# Patient Record
Sex: Female | Born: 1960 | Race: White | Hispanic: No | Marital: Married | Smoking: Never smoker
Health system: Southern US, Community
[De-identification: ages and names within clinical notes are randomized; demographics above are authoritative.]

## PROBLEM LIST (undated history)

## (undated) DIAGNOSIS — H269 Unspecified cataract: Secondary | ICD-10-CM

## (undated) DIAGNOSIS — I10 Essential (primary) hypertension: Secondary | ICD-10-CM

## (undated) DIAGNOSIS — E119 Type 2 diabetes mellitus without complications: Secondary | ICD-10-CM

## (undated) DIAGNOSIS — F419 Anxiety disorder, unspecified: Secondary | ICD-10-CM

## (undated) HISTORY — PX: APPENDECTOMY: SHX54

## (undated) HISTORY — PX: BREAST SURGERY: SHX581

## (undated) HISTORY — DX: Type 2 diabetes mellitus without complications: E11.9

## (undated) HISTORY — DX: Anxiety disorder, unspecified: F41.9

## (undated) HISTORY — DX: Unspecified cataract: H26.9

## (undated) HISTORY — DX: Essential (primary) hypertension: I10

---

## 1998-04-30 ENCOUNTER — Other Ambulatory Visit: Admission: RE | Admit: 1998-04-30 | Discharge: 1998-04-30 | Payer: Self-pay | Admitting: Obstetrics and Gynecology

## 1998-05-05 ENCOUNTER — Encounter: Admission: RE | Admit: 1998-05-05 | Discharge: 1998-08-03 | Payer: Self-pay | Admitting: Endocrinology

## 1998-09-02 ENCOUNTER — Inpatient Hospital Stay (HOSPITAL_COMMUNITY): Admission: AD | Admit: 1998-09-02 | Discharge: 1998-09-02 | Payer: Self-pay | Admitting: *Deleted

## 1998-09-06 ENCOUNTER — Inpatient Hospital Stay (HOSPITAL_COMMUNITY): Admission: AD | Admit: 1998-09-06 | Discharge: 1998-09-06 | Payer: Self-pay | Admitting: Obstetrics and Gynecology

## 1998-09-08 ENCOUNTER — Inpatient Hospital Stay (HOSPITAL_COMMUNITY): Admission: AD | Admit: 1998-09-08 | Discharge: 1998-09-11 | Payer: Self-pay | Admitting: Obstetrics and Gynecology

## 1998-09-15 ENCOUNTER — Encounter (HOSPITAL_COMMUNITY): Admission: RE | Admit: 1998-09-15 | Discharge: 1998-12-14 | Payer: Self-pay | Admitting: Obstetrics and Gynecology

## 1999-06-30 ENCOUNTER — Other Ambulatory Visit: Admission: RE | Admit: 1999-06-30 | Discharge: 1999-06-30 | Payer: Self-pay | Admitting: Obstetrics and Gynecology

## 2000-08-14 ENCOUNTER — Other Ambulatory Visit: Admission: RE | Admit: 2000-08-14 | Discharge: 2000-08-14 | Payer: Self-pay | Admitting: Obstetrics and Gynecology

## 2001-07-04 ENCOUNTER — Other Ambulatory Visit: Admission: RE | Admit: 2001-07-04 | Discharge: 2001-07-04 | Payer: Self-pay | Admitting: Obstetrics and Gynecology

## 2002-09-01 ENCOUNTER — Other Ambulatory Visit: Admission: RE | Admit: 2002-09-01 | Discharge: 2002-09-01 | Payer: Self-pay | Admitting: Obstetrics and Gynecology

## 2003-05-06 ENCOUNTER — Emergency Department (HOSPITAL_COMMUNITY): Admission: EM | Admit: 2003-05-06 | Discharge: 2003-05-06 | Payer: Self-pay | Admitting: Emergency Medicine

## 2003-12-08 ENCOUNTER — Other Ambulatory Visit: Admission: RE | Admit: 2003-12-08 | Discharge: 2003-12-08 | Payer: Self-pay | Admitting: Obstetrics and Gynecology

## 2004-06-23 ENCOUNTER — Encounter: Admission: RE | Admit: 2004-06-23 | Discharge: 2004-06-23 | Payer: Self-pay | Admitting: Obstetrics and Gynecology

## 2004-11-22 ENCOUNTER — Other Ambulatory Visit: Admission: RE | Admit: 2004-11-22 | Discharge: 2004-11-22 | Payer: Self-pay | Admitting: Obstetrics and Gynecology

## 2005-03-31 ENCOUNTER — Ambulatory Visit: Payer: Self-pay | Admitting: Family Medicine

## 2005-07-17 ENCOUNTER — Ambulatory Visit: Payer: Self-pay | Admitting: Family Medicine

## 2005-08-23 ENCOUNTER — Encounter: Payer: Self-pay | Admitting: Obstetrics and Gynecology

## 2005-10-04 ENCOUNTER — Encounter: Admission: RE | Admit: 2005-10-04 | Discharge: 2005-10-04 | Payer: Self-pay | Admitting: Obstetrics and Gynecology

## 2006-07-02 ENCOUNTER — Encounter: Admission: RE | Admit: 2006-07-02 | Discharge: 2006-07-02 | Payer: Self-pay | Admitting: Obstetrics and Gynecology

## 2006-12-27 ENCOUNTER — Encounter: Admission: RE | Admit: 2006-12-27 | Discharge: 2006-12-27 | Payer: Self-pay | Admitting: Obstetrics and Gynecology

## 2008-01-30 ENCOUNTER — Encounter: Admission: RE | Admit: 2008-01-30 | Discharge: 2008-01-30 | Payer: Self-pay | Admitting: Obstetrics and Gynecology

## 2009-02-01 ENCOUNTER — Encounter: Admission: RE | Admit: 2009-02-01 | Discharge: 2009-02-01 | Payer: Self-pay | Admitting: Obstetrics and Gynecology

## 2009-02-05 ENCOUNTER — Encounter: Admission: RE | Admit: 2009-02-05 | Discharge: 2009-02-05 | Payer: Self-pay | Admitting: Obstetrics and Gynecology

## 2009-02-24 ENCOUNTER — Encounter (INDEPENDENT_AMBULATORY_CARE_PROVIDER_SITE_OTHER): Payer: Self-pay | Admitting: Surgery

## 2009-02-24 ENCOUNTER — Ambulatory Visit (HOSPITAL_BASED_OUTPATIENT_CLINIC_OR_DEPARTMENT_OTHER): Admission: RE | Admit: 2009-02-24 | Discharge: 2009-02-24 | Payer: Self-pay | Admitting: Surgery

## 2010-05-15 ENCOUNTER — Encounter: Payer: Self-pay | Admitting: Obstetrics and Gynecology

## 2010-07-27 LAB — GLUCOSE, CAPILLARY
Glucose-Capillary: 135 mg/dL — ABNORMAL HIGH (ref 70–99)
Glucose-Capillary: 136 mg/dL — ABNORMAL HIGH (ref 70–99)

## 2010-07-27 LAB — BASIC METABOLIC PANEL
CO2: 29 mEq/L (ref 19–32)
Calcium: 9 mg/dL (ref 8.4–10.5)
GFR calc non Af Amer: >60 mL/min (ref >60)

## 2012-05-22 ENCOUNTER — Other Ambulatory Visit: Payer: Self-pay

## 2012-06-08 ENCOUNTER — Ambulatory Visit: Payer: Self-pay | Admitting: Emergency Medicine

## 2012-06-08 VITALS — BP 128/88 | HR 97 | Temp 99.0°F | Resp 16 | Ht 62.0 in | Wt 220.4 lb

## 2012-06-08 DIAGNOSIS — J209 Acute bronchitis, unspecified: Secondary | ICD-10-CM

## 2012-06-08 DIAGNOSIS — J018 Other acute sinusitis: Secondary | ICD-10-CM

## 2012-06-08 MED ORDER — HYDROCOD POLST-CHLORPHEN POLST 10-8 MG/5ML PO LQCR: 5.0000 mL | Freq: Two times a day (BID) | ORAL | Status: DC | PRN

## 2012-06-08 MED ORDER — LEVOFLOXACIN 500 MG PO TABS: 500.0000 mg | ORAL_TABLET | Freq: Every day | ORAL | Status: DC

## 2012-06-08 MED ORDER — CEFPROZIL 500 MG PO TABS: 500.0000 mg | ORAL_TABLET | Freq: Two times a day (BID) | ORAL | Status: AC

## 2012-06-08 MED ORDER — PSEUDOEPHEDRINE-GUAIFENESIN ER 60-600 MG PO TB12: 1.0000 | ORAL_TABLET | Freq: Two times a day (BID) | ORAL | Status: AC

## 2012-06-08 NOTE — Patient Instructions (Addendum)

## 2012-06-08 NOTE — Addendum Note (Signed)
Addended by: Carmelina Dane on: 06/08/2012 03:11 PM   Modules accepted: Orders, Medications

## 2012-06-08 NOTE — Progress Notes (Signed)
[facility] [address] [place] [address] [phone]  Date:  06/08/2012   Name:  Michaela Parker   DOB:  11/15/1960   MRN:  [id number]  PCP:  No primary provider on file.    Chief Complaint: Cough, Shortness of Breath, Sore Throat and Adenopathy   History of Present Illness:  Michaela Parker is a 52 y.o. very pleasant female patient who presents with the following:  Ill for past few weeks. Treated with round of amoxicillin and did not improve.  Has nasal congestion and drainage.  Cough that is productive purulent sputum in AM.  No wheezing but short of breath feeling.  No fever or chills. Has fatigue and malaise.  Not sleeping well.  Has sore throat and dysphagia.  No improvement with OTC meds.    There is no problem list on file for this patient.   Past Medical History  Diagnosis Date  . Anxiety   . Diabetes mellitus without complication     Past Surgical History  Procedure Laterality Date  . Appendectomy    . Breast surgery    . Cesarean section      History  Substance Use Topics  . Smoking status: Never Smoker   . Smokeless tobacco: Not on file  . Alcohol Use: No    Family History  Problem Relation Age of Onset  . COPD Mother   . Cancer Father   . Cancer Sister   . Heart disease Brother     No Known Allergies  Medication list has been reviewed and updated.  No current outpatient prescriptions on file prior to visit.   No current facility-administered medications on file prior to visit.    Review of Systems:  As per HPI, otherwise negative.    Physical Examination: Filed Vitals:   06/08/12 1338  BP: 128/88  Pulse: 97  Temp: 99 F (37.2 C)  Resp: 16   Filed Vitals:   06/08/12 1338  Height: 5\' 2"  (1.575 m)  Weight: 220 lb 6.4 oz (99.973 kg)   Body mass index is 40.3 kg/(m^2). Ideal Body Weight: Weight in (lb) to have BMI = 25: 136.4  GEN: WDWN, NAD, Non-toxic, A & O x 3 HEENT: Atraumatic, Normocephalic. Neck supple.  No masses, No LAD. Ears and Nose: No external deformity. CV: RRR, No M/G/R. No JVD. No thrill. No extra heart sounds. PULM: CTA B, no wheezes, crackles, rhonchi. No retractions. No resp. distress. No accessory muscle use. ABD: S, NT, ND, +BS. No rebound. No HSM. EXTR: No c/c/e NEURO Normal gait.  PSYCH: Normally interactive. Conversant. Not depressed or anxious appearing.  Calm demeanor.    Assessment and Plan: Sinusitis Bronchitis tussionex mucinex d levaquin Follow up as needed  Carmelina Dane, MD

## 2013-01-09 ENCOUNTER — Other Ambulatory Visit: Payer: Self-pay | Admitting: Obstetrics and Gynecology

## 2013-06-27 ENCOUNTER — Other Ambulatory Visit: Payer: Self-pay

## 2013-06-27 DIAGNOSIS — Z1231 Encounter for screening mammogram for malignant neoplasm of breast: Secondary | ICD-10-CM

## 2013-07-16 ENCOUNTER — Ambulatory Visit: Payer: Self-pay

## 2013-07-23 ENCOUNTER — Ambulatory Visit
Admission: RE | Admit: 2013-07-23 | Discharge: 2013-07-23 | Disposition: A | Discharge status: Home / Self Care | Payer: BC Managed Care – PPO | Source: Ambulatory Visit

## 2013-07-23 DIAGNOSIS — Z1231 Encounter for screening mammogram for malignant neoplasm of breast: Secondary | ICD-10-CM

## 2013-11-17 ENCOUNTER — Other Ambulatory Visit: Payer: Self-pay | Admitting: Obstetrics and Gynecology

## 2013-11-17 DIAGNOSIS — N644 Mastodynia: Secondary | ICD-10-CM

## 2013-11-17 DIAGNOSIS — N6320 Unspecified lump in the left breast, unspecified quadrant: Secondary | ICD-10-CM

## 2013-11-19 ENCOUNTER — Encounter (INDEPENDENT_AMBULATORY_CARE_PROVIDER_SITE_OTHER): Payer: Self-pay

## 2013-11-19 ENCOUNTER — Ambulatory Visit
Admission: RE | Admit: 2013-11-19 | Discharge: 2013-11-19 | Disposition: A | Discharge status: Home / Self Care | Payer: BC Managed Care – PPO | Source: Ambulatory Visit | Attending: Obstetrics and Gynecology | Admitting: Obstetrics and Gynecology

## 2013-11-19 DIAGNOSIS — N6320 Unspecified lump in the left breast, unspecified quadrant: Secondary | ICD-10-CM

## 2013-11-19 DIAGNOSIS — N644 Mastodynia: Secondary | ICD-10-CM

## 2014-03-11 ENCOUNTER — Other Ambulatory Visit: Payer: Self-pay | Admitting: Obstetrics and Gynecology

## 2014-03-16 LAB — CYTOLOGY - PAP

## 2015-05-14 ENCOUNTER — Emergency Department (HOSPITAL_COMMUNITY): Payer: Self-pay

## 2015-05-14 ENCOUNTER — Emergency Department (HOSPITAL_COMMUNITY)
Admission: EM | Admit: 2015-05-14 | Discharge: 2015-05-14 | Disposition: A | Discharge status: Home / Self Care | Payer: Self-pay | Attending: Emergency Medicine | Admitting: Emergency Medicine

## 2015-05-14 ENCOUNTER — Encounter (HOSPITAL_COMMUNITY): Payer: Self-pay | Admitting: Emergency Medicine

## 2015-05-14 DIAGNOSIS — R42 Dizziness and giddiness: Secondary | ICD-10-CM | POA: Insufficient documentation

## 2015-05-14 DIAGNOSIS — E119 Type 2 diabetes mellitus without complications: Secondary | ICD-10-CM | POA: Insufficient documentation

## 2015-05-14 DIAGNOSIS — R61 Generalized hyperhidrosis: Secondary | ICD-10-CM | POA: Insufficient documentation

## 2015-05-14 DIAGNOSIS — Z7982 Long term (current) use of aspirin: Secondary | ICD-10-CM | POA: Insufficient documentation

## 2015-05-14 DIAGNOSIS — Z794 Long term (current) use of insulin: Secondary | ICD-10-CM | POA: Insufficient documentation

## 2015-05-14 DIAGNOSIS — R0789 Other chest pain: Secondary | ICD-10-CM | POA: Insufficient documentation

## 2015-05-14 DIAGNOSIS — Z79899 Other long term (current) drug therapy: Secondary | ICD-10-CM | POA: Insufficient documentation

## 2015-05-14 DIAGNOSIS — F419 Anxiety disorder, unspecified: Secondary | ICD-10-CM | POA: Insufficient documentation

## 2015-05-14 LAB — I-STAT CHEM 8, ED
BUN: 11 mg/dL (ref 6–20)
CREATININE: 0.5 mg/dL (ref 0.44–1.00)
Calcium, Ion: 1.15 mmol/L (ref 1.12–1.23)
Chloride: 98 mmol/L — ABNORMAL LOW (ref 101–111)
Glucose, Bld: 284 mg/dL — ABNORMAL HIGH (ref 65–99)
HEMATOCRIT: 50 % — AB (ref 36.0–46.0)
HEMOGLOBIN: 17 g/dL — AB (ref 12.0–15.0)
POTASSIUM: 4.1 mmol/L (ref 3.5–5.1)
SODIUM: 137 mmol/L (ref 135–145)
TCO2: 24 mmol/L (ref 0–100)

## 2015-05-14 LAB — CBC
HEMATOCRIT: 44.5 % (ref 36.0–46.0)
Hemoglobin: 15.8 g/dL — ABNORMAL HIGH (ref 12.0–15.0)
MCH: 32.1 pg (ref 26.0–34.0)
MCHC: 35.5 g/dL (ref 30.0–36.0)
MCV: 90.4 fL (ref 78.0–100.0)
PLATELETS: 221 10*3/uL (ref 150–400)
RBC: 4.92 MIL/uL (ref 3.87–5.11)
RDW: 12.8 % (ref 11.5–15.5)
WBC: 11.1 10*3/uL — AB (ref 4.0–10.5)

## 2015-05-14 LAB — I-STAT TROPONIN, ED
TROPONIN I, POC: 0 ng/mL (ref 0.00–0.08)
TROPONIN I, POC: 0 ng/mL (ref 0.00–0.08)

## 2015-05-14 LAB — CBG MONITORING, ED: GLUCOSE-CAPILLARY: 242 mg/dL — AB (ref 65–99)

## 2015-05-14 MED ORDER — ASPIRIN 81 MG PO CHEW
324.0000 mg | CHEWABLE_TABLET | Freq: Once | ORAL | Status: AC
  Administered 2015-05-14: 324 mg via ORAL
  Filled 2015-05-14: qty 4

## 2015-05-14 NOTE — ED Notes (Signed)
Pt's CBG result was 242. Informed Holley - RN.

## 2015-05-14 NOTE — ED Provider Notes (Signed)
CSN: [id number]     Arrival date & time 05/14/15  4098 History   First MD Initiated Contact with Patient 05/14/15 0840     Chief Complaint  Patient presents with  . Chest Pain     (Consider location/radiation/quality/duration/timing/severity/associated sxs/prior Treatment) Patient is a 55 y.o. female presenting with allergic reaction.  Allergic Reaction Presenting symptoms: no wheezing   Patient is a 55 year old female with a history of hypertension, diabetes, hyperlipidemia, anxiety, who presents today with intermittent chest heaviness, bilateral arm tingling, and nausea without vomiting, and fatigue. She states that she has been more fatigued lately but otherwise been in her usual state of health. She checked her blood glucose at home which was 200. Her main fear is that this an allergic reaction to starting Lexapro last night around 6 PM. There is otherwise no rashes, wheezing, or diarrhea. Nothing she tried made this better or worse. She denies any fevers, cough, changes in urine or bowel movements, and denies lower extremity swelling or pain. No headache, or changes in vision.  Past Medical History  Diagnosis Date  . Anxiety   . Diabetes mellitus without complication [facility])    Past Surgical History  Procedure Laterality Date  . Appendectomy    . Breast surgery    . Cesarean section     Family History  Problem Relation Age of Onset  . COPD Mother   . Cancer Father   . Cancer Sister   . Heart disease Brother    Social History  Substance Use Topics  . Smoking status: Never Smoker   . Smokeless tobacco: None  . Alcohol Use: No   OB History    No data available     Review of Systems  Constitutional: Positive for diaphoresis. Negative for chills.  HENT: Negative.   Eyes: Negative for visual disturbance.  Respiratory: Positive for chest tightness. Negative for cough, wheezing and stridor.   Cardiovascular: Negative for palpitations and leg swelling.  Gastrointestinal:  Negative.   Genitourinary: Negative.   Musculoskeletal: Negative.   Skin: Negative.   Neurological: Positive for dizziness. Negative for syncope and headaches.  Psychiatric/Behavioral: The patient is nervous/anxious.       Allergies  Review of patient's allergies indicates no known allergies.  Home Medications   Prior to Admission medications   Medication Sig Start Date End Date Taking? Authorizing Provider  Aspirin 81 MG EC tablet Take 81 mg by mouth daily.   Yes Historical Provider, MD  B Complex-C (SUPER B COMPLEX PO) Take 1 tablet by mouth daily.   Yes Historical Provider, MD  benazepril (LOTENSIN) 10 MG tablet Take 10 mg by mouth daily.   Yes Historical Provider, MD  escitalopram (LEXAPRO) 10 MG tablet Take 10 mg by mouth daily.   Yes Historical Provider, MD  estradiol (VIVELLE-DOT) 0.075 MG/24HR Place 1 patch onto the skin 2 (two) times a week.   Yes Historical Provider, MD  Ferrous Sulfate Dried (SLOW RELEASE IRON) 140 (45 Fe) MG TBCR Take 1 tablet by mouth daily.   Yes Historical Provider, MD  insulin NPH-regular Human (NOVOLIN 70/30) (70-30) 100 UNIT/ML injection Inject 0-85 Units into the skin 2 (two) times daily as needed (blood sugar reading).   Yes Historical Provider, MD  pravastatin (PRAVACHOL) 20 MG tablet Take 20 mg by mouth daily.   Yes Historical Provider, MD  progesterone (PROMETRIUM) 100 MG capsule Take 100 mg by mouth daily.   Yes Historical Provider, MD  vitamin E 400 UNIT capsule Take 400 Units  by mouth daily.   Yes Historical Provider, MD   BP 151/68 mmHg  Pulse 82  Temp(Src) 98.3 F (36.8 C) (Oral)  Resp 20  SpO2 96% Physical Exam  Constitutional: She is oriented to person, place, and time. She appears well-developed and well-nourished.  HENT:  Head: Normocephalic and atraumatic.  Mouth/Throat: No oropharyngeal exudate.  Eyes: Pupils are equal, round, and reactive to light. No scleral icterus.  Neck: Normal range of motion. Neck supple.   Cardiovascular: Normal rate, regular rhythm, normal heart sounds and intact distal pulses.  Exam reveals no gallop and no friction rub.   No murmur heard. Pulmonary/Chest: Effort normal and breath sounds normal. No respiratory distress. She has no wheezes. She has no rales. She exhibits no tenderness.  Abdominal: Soft. She exhibits no distension. There is no tenderness. There is no rebound and no guarding.  Musculoskeletal: Normal range of motion. She exhibits no edema or tenderness.  Neurological: She is alert and oriented to person, place, and time. No cranial nerve deficit. She exhibits normal muscle tone. Coordination normal.  Skin: Skin is warm and dry. No rash noted. She is not diaphoretic. No erythema. No pallor.  Psychiatric: She has a normal mood and affect.  Nursing note and vitals reviewed.   ED Course  Procedures (including critical care time) Labs Review Labs Reviewed  CBC - Abnormal; Notable for the following:    WBC 11.1 (*)    Hemoglobin 15.8 (*)    All other components within normal limits  I-STAT CHEM 8, ED - Abnormal; Notable for the following:    Chloride 98 (*)    Glucose, Bld 284 (*)    Hemoglobin 17.0 (*)    HCT 50.0 (*)    All other components within normal limits  CBG MONITORING, ED - Abnormal; Notable for the following:    Glucose-Capillary 242 (*)    All other components within normal limits  I-STAT TROPOININ, ED  I-STAT TROPOININ, ED    Imaging Review Dg Chest 2 View  05/14/2015  CLINICAL DATA:  A feeling of hottness across the upper chest since this am EXAM: CHEST - 2 VIEW COMPARISON:  None available FINDINGS: Mild perihilar and bibasilar interstitial prominence, of uncertain chronicity. No confluent airspace disease. No effusion. Heart size normal. No pneumothorax. Visualized skeletal structures are unremarkable. IMPRESSION: 1. Mild perihilar and bibasilar interstitial prominence, of uncertain chronicity. Electronically Signed   By: Corlis Leak M.D.    On: 05/14/2015 09:45   I have personally reviewed and evaluated these images and lab results as part of my medical decision-making.   EKG Interpretation   Date/Time:  Friday May 14 2015 08:44:35 EST Ventricular Rate:  90 PR Interval:  159 QRS Duration: 98 QT Interval:  374 QTC Calculation: 458 R Axis:   44 Text Interpretation:  Sinus rhythm Probable anteroseptal infarct, old No  significant change since last tracing Confirmed by KNAPP  MD-J, JON  ([id number]) on 05/14/2015 8:57:42 AM      MDM   Final diagnoses:  Chest heaviness    Patient is a 55 year old female who presents today with "chest heaviness with heat going through my chest". She felt that it was a an allergic reaction to starting Lexapro yesterday. Denies any actual pain. Patient has a heart score of 3. EKG with no acute ischemic changes or arrhythmias. She currently is asymptomatic. Troponin and basic lab work reassuring with a chest x-ray without acute findings. Differential diagnoses includes anxiety, side effect from Lexapro, GERD, musculoskeletal,  and doubt PE and ACS at this time. With a HEART score of 3 will plan to get a delta troponin  Delta troponin negative. Patient still without symptoms.  I have reviewed all labs and ekg. Patient stable for discharge home.  I have reviewed all results with the patient. Advised to f/u with PCP in 2 days for guidance on anxiety medication and recommended holding doses of lexapro. Patient agrees to stated plan. All questions answered. Advised to call or return to have any questions, new symptoms, change in symptoms, or symptoms that they do not understand.    Marijean Niemann, MD 05/14/15 1636  Linwood Dibbles, MD 05/15/15 [phone]

## 2015-05-14 NOTE — ED Notes (Signed)
Pt to ER via [facility] with complaint of allergic reaction after beginning Lexapro yesterday at 6 pm. Pt woke up this morning at 6 am this morning with "heat going through her chest" with numbness and tingling in her arms and legs. Pt has hx of anxiety. Pt reports intermittent nausea. VS -139/62. Pt is a/o x4.

## 2015-05-14 NOTE — Discharge Instructions (Signed)

## 2016-09-07 DIAGNOSIS — Z8679 Personal history of other diseases of the circulatory system: Secondary | ICD-10-CM | POA: Insufficient documentation

## 2016-09-07 DIAGNOSIS — E119 Type 2 diabetes mellitus without complications: Secondary | ICD-10-CM | POA: Insufficient documentation

## 2017-12-11 ENCOUNTER — Ambulatory Visit (HOSPITAL_COMMUNITY)
Admission: RE | Admit: 2017-12-11 | Discharge: 2017-12-11 | Disposition: A | Discharge status: Home / Self Care | Payer: Self-pay | Source: Ambulatory Visit | Attending: Family | Admitting: Family

## 2017-12-11 ENCOUNTER — Other Ambulatory Visit (HOSPITAL_COMMUNITY): Payer: Self-pay | Admitting: Family Medicine

## 2017-12-11 DIAGNOSIS — M79662 Pain in left lower leg: Secondary | ICD-10-CM | POA: Insufficient documentation

## 2019-08-28 ENCOUNTER — Encounter: Payer: Self-pay | Admitting: Family Medicine

## 2019-08-28 ENCOUNTER — Telehealth: Payer: Self-pay | Admitting: Family Medicine

## 2019-08-28 ENCOUNTER — Ambulatory Visit (INDEPENDENT_AMBULATORY_CARE_PROVIDER_SITE_OTHER): Payer: Self-pay | Admitting: Family Medicine

## 2019-08-28 ENCOUNTER — Other Ambulatory Visit: Payer: Self-pay

## 2019-08-28 ENCOUNTER — Ambulatory Visit (INDEPENDENT_AMBULATORY_CARE_PROVIDER_SITE_OTHER): Payer: Self-pay

## 2019-08-28 DIAGNOSIS — M5442 Lumbago with sciatica, left side: Secondary | ICD-10-CM

## 2019-08-28 MED ORDER — GABAPENTIN 100 MG PO CAPS: ORAL_CAPSULE | ORAL | 3 refills | Status: DC

## 2019-08-28 MED ORDER — HYDROCODONE-ACETAMINOPHEN 5-325 MG PO TABS: 1.0000 | ORAL_TABLET | Freq: Every evening | ORAL | 0 refills | Status: DC | PRN

## 2019-08-28 NOTE — Progress Notes (Signed)
Office Visit Note   Patient: Michaela Parker           Date of Birth: [birth year]           MRN: [id number] Visit Date: 08/28/2019 Requested by: Adrian Prince, MD [address] [place],  [place] [address] PCP: Adrian Prince, MD  Subjective: Chief Complaint  Patient presents with  . Lower Back - Pain    Pain started in left buttock 6 weeks ago. Pain now goes around to lateral hip and in lower leg to foot at times. NKI. Has tried massage,chiropractor, Toradol injection IM, muscle relaxer & NSAIDs. Had short-lived relief after first 2 chiro visits.    HPI: She is here with low back and left leg pain.  Symptoms started about 6 weeks ago, no injury.  Pain in the buttocks at first, with eventual radiation down the leg to the foot.  She went to a massage therapist couple times with temporary improvement.  She then went to her PCP who gave her a Toradol injection and muscle relaxant along with meloxicam.  Symptoms were not improving so she went to a chiropractor multiple times.  At first she thought she was getting some relief but then it quit helping so now she presents for evaluation.  Denies any history of rash, denies bowel or bladder dysfunction, denies fevers or chills.  She has a new job but has not had to miss a lot of work because of her pain.  She has diabetes which has been suboptimally controlled.                ROS:   All other systems were reviewed and are negative.  Objective: Vital Signs: There were no vitals taken for this visit.  Physical Exam:  General:  Alert and oriented, in no acute distress. Pulm:  Breathing unlabored. Psy:  Normal mood, congruent affect. Skin: No visible rash. Low back: No midline tenderness.  She is very tender in the left sciatic notch.  Straight leg raise negative, lower extremity strength and reflexes are normal except for ankle dorsiflexion which is weak at 4/5 compared to normal strength on the right.  DTRs are 2+ knee and ankle.  Imaging: XR  Lumbar Spine 2-3 Views  Result Date: 08/28/2019 X-rays lumbar spine: No sign of compression fracture or neoplasm.  She has moderate asymmetric disc space narrowing at L3-4, right side worse than left.  There is bone spurring on the lateral aspect of the vertebra.  There is mild degenerative disc disease and it L4-5 and L5-S1.  Mild SI joint DJD bilaterally.  No hip arthritis seen.  In the soft tissues there are 3 round radiopaque densities in the right upper quadrant which could be gallstones.   Assessment & Plan: 1.  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 -We will proceed with MRI scan, epidural steroid injection.  We will try gabapentin for pain with hydrocodone at night for more severe pain. -Consider formal PT depending on MRI findings, although she does not have health insurance right now.     Procedures: No procedures performed  No notes on file     PMFS History: There are no problems to display for this patient.  Past Medical History:  Diagnosis Date  . Anxiety   . Diabetes mellitus without complication (HCC)     Family History  Problem Relation Age of Onset  . COPD Mother   . Cancer Father   .  Cancer Sister   . Heart disease Brother     Past Surgical History:  Procedure Laterality Date  . APPENDECTOMY    . BREAST SURGERY    . CESAREAN SECTION     Social History   Occupational History  . Not on file  Tobacco Use  . Smoking status: Never Smoker  Substance and Sexual Activity  . Alcohol use: No  . Drug use: No  . Sexual activity: Yes

## 2019-08-28 NOTE — Telephone Encounter (Signed)
I spoke with the pharmacist, Merla Riches: she wanted to get confirmation on the directions on the gabapentin and diagnosis for the pain medication. Per Dr. Prince Rome, the directions are correct as written and sciatica is the diagnosis.

## 2019-08-28 NOTE — Telephone Encounter (Signed)
Pharmacy called.   They need to clear up the directions on the prescriptions recently sent in for the patient.  Call back: [phone]

## 2019-08-29 ENCOUNTER — Telehealth: Payer: Self-pay | Admitting: Family Medicine

## 2019-08-29 NOTE — Telephone Encounter (Signed)
[facility] has called the patient and scheduled her for an MRI. She may not need to get that right now, but she will not cancel it yet in the case that she will indeed need to get it before she can have an ESI. Will check with the referral coordinator about having the ESI at [facility] instead, if they can see her sooner than her scheduled appointment with Dr. Alvester Morin. I will call the patient once I have more information on this.

## 2019-08-29 NOTE — Telephone Encounter (Signed)
We commonly order ESI without getting an MRI first.  I could refer her to [facility] instead to see if they can do it sooner if she'd prefer.

## 2019-08-29 NOTE — Telephone Encounter (Signed)
Left message on the patient's voice mail to call me back. 

## 2019-08-29 NOTE — Telephone Encounter (Signed)
I called and spoke with the patient. She had multiple questions about the ESI. She was given an office visit appointment with Dr. Alvester Morin for 09/17/2019 for a consultation and then may not get the [facility] until early June. The patient said she thought she would be getting the injection sooner than that and is very anxious. She wanted me to check with Dr. Prince Rome again about whether or not she should/or needs to have an MRI prior to the [facility], or can she just get the injection? Money is a concern for her, but she will do whatever is best.

## 2019-08-29 NOTE — Telephone Encounter (Signed)
Patient called requesting a call back.  She has some questions in regards to her appointment with Dr. Alvester Morin.  She stated that she spoke with someone in Dr. [place] Blas office and was told different information than what she was told from Dr. Prince Rome.  CB#[phone].  Thank you.

## 2019-08-29 NOTE — Telephone Encounter (Signed)
Patient called requesting a call back from Terri. Patient stated Terri called and left a message. Patient phone number is [phone].

## 2019-08-29 NOTE — Telephone Encounter (Signed)
Duplicate - see other message from today. °

## 2019-09-02 ENCOUNTER — Other Ambulatory Visit: Payer: Self-pay

## 2019-09-02 ENCOUNTER — Telehealth: Payer: Self-pay | Admitting: *Deleted

## 2019-09-02 DIAGNOSIS — M5442 Lumbago with sciatica, left side: Secondary | ICD-10-CM

## 2019-09-02 NOTE — Telephone Encounter (Signed)
Received call from Pt wanting to know if can get a cortizone shot at [facility] imaging vs Dr. Alvester Morin sooner, I called Jenel Lucks and left message to return call to see what their schedules are like at [facility] imaging.

## 2019-09-02 NOTE — Telephone Encounter (Signed)
New referral placed to [facility] for [facility].

## 2019-09-09 ENCOUNTER — Telehealth: Payer: Self-pay | Admitting: Family Medicine

## 2019-09-09 MED ORDER — HYDROCODONE-ACETAMINOPHEN 5-325 MG PO TABS: 1.0000 | ORAL_TABLET | Freq: Every evening | ORAL | 0 refills | Status: DC | PRN

## 2019-09-09 NOTE — Telephone Encounter (Signed)
Sent!

## 2019-09-09 NOTE — Telephone Encounter (Signed)
Please advise 

## 2019-09-09 NOTE — Telephone Encounter (Signed)
Pt called in stating she needed a refill on her hydrocodone since she won't be in for her MRI until 09/27/19; Pt would like a call back when this has been done.   [phone]

## 2019-09-09 NOTE — Telephone Encounter (Signed)
I called and advised the patient. She has her MRI of her Lsp this Saturday, with an ESI there on Monday the 24th.

## 2019-09-16 ENCOUNTER — Telehealth: Payer: Self-pay | Admitting: Family Medicine

## 2019-09-16 MED ORDER — HYDROCODONE-ACETAMINOPHEN 5-325 MG PO TABS: 1.0000 | ORAL_TABLET | Freq: Three times a day (TID) | ORAL | 0 refills | Status: DC | PRN

## 2019-09-16 NOTE — Telephone Encounter (Signed)
Sent!

## 2019-09-16 NOTE — Telephone Encounter (Signed)
I called and advised the patient her medication refill has been sent in to her pharmacy.

## 2019-09-16 NOTE — Telephone Encounter (Signed)
Patient called requesting an RX refill on her Hydrocodone.  Patient is going through about 3 pills a day due to the pain.  She uses [facility] on [address].  CB#[phone].  Thank you.

## 2019-09-16 NOTE — Telephone Encounter (Signed)
Please advise. Her MRI is 6/05 and her ESI is 6/07, both at [facility].

## 2019-09-17 ENCOUNTER — Ambulatory Visit: Payer: Self-pay | Admitting: Physical Medicine and Rehabilitation

## 2019-09-23 ENCOUNTER — Telehealth: Payer: Self-pay | Admitting: Family Medicine

## 2019-09-23 NOTE — Telephone Encounter (Signed)
I called the patient: she says she only has 2 pills left of the hydrocodone, having taken 3/day. I told her the 30 pills should have lasted 10 days, taking it 3 pills/day. The patient responded that she was only given 20 pills, even though it says #30 on the bottle. She said she will check with the pharmacy on this and call me back tomorrow, if needed.

## 2019-09-23 NOTE — Telephone Encounter (Signed)
Patient called.   She needs a refill on her [facility]   Call back: [phone]

## 2019-09-27 ENCOUNTER — Other Ambulatory Visit: Payer: Self-pay

## 2019-09-27 ENCOUNTER — Ambulatory Visit
Admission: RE | Admit: 2019-09-27 | Discharge: 2019-09-27 | Disposition: A | Discharge status: Home / Self Care | Payer: Self-pay | Source: Ambulatory Visit | Attending: Family Medicine | Admitting: Family Medicine

## 2019-09-27 DIAGNOSIS — M5442 Lumbago with sciatica, left side: Secondary | ICD-10-CM

## 2019-09-29 ENCOUNTER — Telehealth: Payer: Self-pay | Admitting: Family Medicine

## 2019-09-29 ENCOUNTER — Ambulatory Visit
Admission: RE | Admit: 2019-09-29 | Discharge: 2019-09-29 | Disposition: A | Discharge status: Home / Self Care | Payer: Self-pay | Source: Ambulatory Visit | Attending: Family Medicine | Admitting: Family Medicine

## 2019-09-29 ENCOUNTER — Other Ambulatory Visit: Payer: Self-pay

## 2019-09-29 DIAGNOSIS — M5442 Lumbago with sciatica, left side: Secondary | ICD-10-CM

## 2019-09-29 MED ORDER — IOPAMIDOL (ISOVUE-M 200) INJECTION 41%
1.0000 mL | Freq: Once | INTRAMUSCULAR | Status: AC
  Administered 2019-09-29: 1 mL via EPIDURAL

## 2019-09-29 MED ORDER — METHYLPREDNISOLONE ACETATE 40 MG/ML INJ SUSP (RADIOLOG
120.0000 mg | Freq: Once | INTRAMUSCULAR | Status: AC
  Administered 2019-09-29: 120 mg via EPIDURAL

## 2019-09-29 NOTE — Telephone Encounter (Signed)
Patient called.   Did not disclose why but she is requesting a call back from Terri   Call back: [phone]

## 2019-09-29 NOTE — Telephone Encounter (Signed)
MRI shows degenerative disc disease at multiple levels.  There is also narrowing of the spinal canal at L4-5 and to a lesser degree at L3-4.  No nerve impingement, no clear-cut indication for surgery at this point.  Hopefully the injection will help.

## 2019-09-29 NOTE — Discharge Instructions (Signed)

## 2019-09-30 MED ORDER — TRAMADOL HCL 50 MG PO TABS: 50.0000 mg | ORAL_TABLET | Freq: Four times a day (QID) | ORAL | 0 refills | Status: DC | PRN

## 2019-09-30 NOTE — Addendum Note (Signed)
Addended by: Lillia Carmel on: 09/30/2019 04:55 PM   Modules accepted: Orders

## 2019-09-30 NOTE — Telephone Encounter (Signed)
Tramadol Rx sent.  Referral made to PT in our office.

## 2019-09-30 NOTE — Telephone Encounter (Signed)
I called and spoke with patient. She states that she got her injection in her back yesterday at [facility].  She states that she would like to know what the plan is for her, should she do PT? She states that she has been taking the Gabapentin and Hydrocodone to help maintain her pain till she got the shot, she wants to know if she can either get a refill of her pain meds or if she can get something that's not a narcotic for pain? To have incase this injection does not work for her?  Please advise

## 2019-09-30 NOTE — Telephone Encounter (Signed)
I called and lmo, advising patient that PT has been ordered as well as Tramadol.

## 2019-10-07 ENCOUNTER — Other Ambulatory Visit: Payer: Self-pay

## 2019-10-07 ENCOUNTER — Ambulatory Visit: Payer: Self-pay | Admitting: Rehabilitative and Restorative Service Providers"

## 2019-10-07 ENCOUNTER — Encounter: Payer: Self-pay | Admitting: Rehabilitative and Restorative Service Providers"

## 2019-10-07 DIAGNOSIS — R293 Abnormal posture: Secondary | ICD-10-CM

## 2019-10-07 DIAGNOSIS — M79605 Pain in left leg: Secondary | ICD-10-CM

## 2019-10-07 DIAGNOSIS — R262 Difficulty in walking, not elsewhere classified: Secondary | ICD-10-CM

## 2019-10-07 DIAGNOSIS — M6281 Muscle weakness (generalized): Secondary | ICD-10-CM

## 2019-10-07 DIAGNOSIS — M5442 Lumbago with sciatica, left side: Secondary | ICD-10-CM

## 2019-10-07 NOTE — Patient Instructions (Signed)
Access Code: [email or web address] URL: [email or web address] Date: 10/07/2019 Prepared by: Chyrel Masson  Exercises Supine Lower Trunk Rotation - 2 x daily - 7 x weekly - 5 reps - 1 sets - 15 hold Prone Press Up on Elbows - 2 x daily - 7 x weekly - 3 sets - 10 reps Right Standing Lateral Shift Correction at Wall - Repetitions - 2 x daily - 7 x weekly - 3 sets - 10 reps - 2-3 hold

## 2019-10-07 NOTE — Therapy (Signed)
[facility] [address] [place], [place], [address] Phone: [phone]   Fax:  [phone]  Physical Therapy Evaluation  Patient Details  Name: BRENT NOTO MRN: [id number] Date of Birth: Jul 06, 1960 Referring Provider (PT): Dr. Prince Rome   Encounter Date: 10/07/2019   PT End of Session - 10/07/19 1133    Visit Number 1    Number of Visits 12    Date for PT Re-Evaluation 11/18/19    Authorization Time Period POC 10/07/2019 - 11/18/2019    PT Start Time 1142    PT Stop Time 1225    PT Time Calculation (min) 43 min    Activity Tolerance Patient tolerated treatment well    Behavior During Therapy [facility] for tasks assessed/performed           Past Medical History:  Diagnosis Date  . Anxiety   . Diabetes mellitus without complication [facility])     Past Surgical History:  Procedure Laterality Date  . APPENDECTOMY    . BREAST SURGERY    . CESAREAN SECTION      There were no vitals filed for this visit.    Subjective Assessment - 10/07/19 1144    Subjective Pt. indicated onset of symptoms about 14 weeks ago with progressive worsening.  Pt. indicated seeing massage, chiro, and regular MD with injection in hip and some medicine with no improvements.  Pt. stated she then saw ortho MD (Dr. Prince Rome) and had MRI performed 09/27/2019 and injection performed 09/29/2019 with some improvements.  Pt. indicated leg pain occurs in lateral lower leg and ankle/foot as well as back pain/buttock pain on Lt side.  Pt. indicated she was performed some stretching to help but didn't help on their own.    Limitations Sitting;Lifting;Walking;House hold activities   work   Diagnostic tests MRI 09/27/2019 showing multilevel changes of lumbar spine L4-L5 most noted, moderate spinal canal stenosis    Currently in Pain? Yes    Pain Score 6    10/10 at worst.   Pain Location Back   Lt lower leg   Pain Orientation Left    Pain Descriptors / Indicators Sharp;Tingling;Aching;Burning     Pain Radiating Towards Lt lower leg laterally    Pain Onset More than a month ago    Pain Frequency Constant    Aggravating Factors  reaching overhead, rolling in bed, stairs, prolonged positioning.    Pain Relieving Factors injection with some reduction.    Effect of Pain on Daily Activities Limited in work, standing              [facility] PT Assessment - 10/07/19 0001      Assessment   Medical Diagnosis Acute Lt sided low back pain, Lt sciatica    Referring Provider (PT) Dr. Prince Rome    Onset Date/Surgical Date 06/23/19      Precautions   Precautions None      Restrictions   Weight Bearing Restrictions No      Balance Screen   Has the patient fallen in the past 6 months No    Has the patient had a decrease in activity level because of a fear of falling?  Yes    Is the patient reluctant to leave their home because of a fear of falling?  Yes   due to pain     Home Environment   Living Environment Private residence    Living Arrangements Spouse/significant other;Children    Type of Home House    Home Layout Two  level      Prior Function   Level of Independence Independent    Vocation Part time employment    Vocation Requirements Sitting primarily   Currently unable to work   Leisure Household activity      Cognition   Overall Cognitive Status Within Functional Limits for tasks assessed      Posture/Postural Control   Posture Comments Rt lateral trunk shift, forward trunk lean      Deep Tendon Reflexes   DTR Assessment Site Patella;Achilles    Patella DTR 2+    Achilles DTR 2+      ROM / Strength   AROM / PROM / Strength Strength;PROM;AROM      AROM   AROM Assessment Site Lumbar    Lumbar Flexion Increased Lt lateral/posterior hip pain, movement to patella    Lumbar Extension 25% ERP lumbar, Lt LE REIS x 3: no change    Lumbar - Right Side Bend movment to lateral epicondyle c Lt lumbar/hip pain.     Lumbar - Left Side Bend movment to lateral epicondyle c Lt  lumbar/hip pain.       PROM   PROM Assessment Site Hip      Strength   Strength Assessment Site Ankle;Knee;Hip    Right/Left Hip Left;Right    Right Hip Flexion 5/5    Left Hip Flexion 5/5    Right/Left Knee Left;Right    Right Knee Flexion 5/5    Right Knee Extension 5/5    Left Knee Flexion 5/5    Left Knee Extension 5/5    Right/Left Ankle Left;Right    Right Ankle Dorsiflexion 5/5    Left Ankle Dorsiflexion 5/5      Palpation   SI assessment  Restriction in cPA L2-L5    Palpation comment Tenderness and TrP noted throughout Lt glute med/max, piriformis, Lt inferior paraspinals      Special Tests    Special Tests Lumbar    Lumbar Tests Slump Test;FABER test;Straight Leg Raise      Slump test   Comment Negative on Lt.  Lt hip pain noted c Rt LE testing      Straight Leg Raise   Comment negative passive SLR bilateral for radicular symptoms, Negative crossed SLR bilateral                      Objective measurements completed on examination: See above findings.       [facility] Adult PT Treatment/Exercise - 10/07/19 0001      Exercises   Exercises Lumbar      Lumbar Exercises: Stretches   Other Lumbar Stretch Exercise supine LTR stretch 15 sec x 5 bilateral, lateral shift correction x 15, prone press up 2 x 10      Manual Therapy   Manual therapy comments Lateral trunk shift corrections                  PT Education - 10/07/19 1133    Education Details HEP, POC    Person(s) Educated Patient    Methods Explanation;Demonstration;Handout    Comprehension Verbalized understanding;Returned demonstration               PT Long Term Goals - 10/07/19 1136      PT LONG TERM GOAL #1   Title Patient will demonstrate/report pain at worst less than or equal to 2/10 to facilitate minimal limitation in daily activity secondary to pain symptoms.    Time 6  Period Weeks    Status New    Target Date 11/18/19      PT LONG TERM GOAL #2   Title  Patient will demonstrate independent use of home exercise program to facilitate ability to maintain/progress functional gains from skilled physical therapy services.    Time 6    Period Weeks    Status New    Target Date 11/18/19      PT LONG TERM GOAL #3   Title Pt. will demonstrate upright standing posture s lateral shift to facilitate upright standing/walking at PLOF.    Time 6    Period Weeks    Status New    Target Date 11/18/19      PT LONG TERM GOAL #4   Title Patient will demonstrate lumbar extension 100 % WFL s symptoms to facilitate upright standing, walking posture at PLOF s limitation.    Time 6    Period Weeks    Status New    Target Date 11/18/19      PT LONG TERM GOAL #5   Title Pt. will demonstrate ability to ascend/descend stairs s restriction for household navigation.    Time 6    Period Weeks    Status New    Target Date 11/18/19      Additional Long Term Goals   Additional Long Term Goals Yes      PT LONG TERM GOAL #6   Title Pt. will demonstrate ability to return to work at [employer].    Time 6    Period Weeks    Status New    Target Date 11/18/19                  Plan - 10/07/19 1135    Clinical Impression Statement Patient is a 59 y.o. female who comes to clinic with complaints of low back pain c Lt LE pain with mobility, strength and movement coordination deficits that impair her ability to perform usual daily and recreational functional activities without increase difficulty/symptoms at this time.  Patient to benefit from skilled PT services to address impairments and limitations to improve to previous level of function without restriction secondary to condition.    Examination-Activity Limitations Bend;Squat;Sleep;Sit;Stairs;Carry;Stand;Dressing;Lift;Locomotion Level;Reach Overhead    Examination-Participation Restrictions Other;Community Activity   work, housework   Stability/Clinical Decision Making Stable/Uncomplicated    Clinical Decision  Making Low    Rehab Potential Good    PT Frequency 2x / week    PT Duration 6 weeks    PT Treatment/Interventions ADLs/Self Care Home Management;Electrical Stimulation;Iontophoresis 4mg /ml Dexamethasone;Moist Heat;Traction;Balance training;Therapeutic exercise;Therapeutic activities;Functional mobility training;Stair training;Gait training;Ultrasound;Neuromuscular re-education;Patient/family education;Manual techniques;Taping;Dry needling;Passive range of motion;Spinal Manipulations;Joint Manipulations    PT Next Visit Plan Lateral shift corrections, lumbar mobility    PT Home Exercise Plan [id number]    Consulted and Agree with Plan of Care Patient           Patient will benefit from skilled therapeutic intervention in order to improve the following deficits and impairments:  Decreased endurance, Hypomobility, Decreased activity tolerance, Decreased strength, Pain, Increased muscle spasms, Difficulty walking, Decreased mobility, Decreased range of motion, Impaired flexibility, Postural dysfunction  Visit Diagnosis: Acute left-sided low back pain with left-sided sciatica - Plan: PT plan of care cert/re-cert  Pain in left leg - Plan: PT plan of care cert/re-cert  Muscle weakness (generalized) - Plan: PT plan of care cert/re-cert  Abnormal posture - Plan: PT plan of care cert/re-cert  Difficulty in walking, not elsewhere classified - Plan:  PT plan of care cert/re-cert     Problem List There are no problems to display for this patient.  Chyrel Masson, PT, DPT, OCS, ATC 10/07/19  12:42 PM    [facility] [address] [place], [place], [address] Phone: [phone]   Fax:  [phone]  Name: ZOUA CAPORASO MRN: [id number] Date of Birth: Feb 20, 1961

## 2019-10-13 ENCOUNTER — Telehealth: Payer: Self-pay | Admitting: Family Medicine

## 2019-10-13 DIAGNOSIS — M5442 Lumbago with sciatica, left side: Secondary | ICD-10-CM

## 2019-10-13 NOTE — Telephone Encounter (Signed)
I called and advised the patient the order has been placed for a 2nd ESI at [facility].

## 2019-10-13 NOTE — Telephone Encounter (Signed)
Her previous ESI at [facility] was on 09/29/19. Please advise.

## 2019-10-13 NOTE — Addendum Note (Signed)
Addended by: Lillia Carmel on: 10/13/2019 12:58 PM   Modules accepted: Orders

## 2019-10-13 NOTE — Telephone Encounter (Signed)
Patient called.   Wants to see about getting set up for another injection in her spine at [facility].   Call back: [phone]

## 2019-10-13 NOTE — Telephone Encounter (Signed)
Orders placed.

## 2019-11-04 ENCOUNTER — Ambulatory Visit
Admission: RE | Admit: 2019-11-04 | Discharge: 2019-11-04 | Disposition: A | Discharge status: Home / Self Care | Payer: Self-pay | Source: Ambulatory Visit | Attending: Family Medicine | Admitting: Family Medicine

## 2019-11-04 DIAGNOSIS — M5442 Lumbago with sciatica, left side: Secondary | ICD-10-CM

## 2019-11-04 MED ORDER — IOPAMIDOL (ISOVUE-M 200) INJECTION 41%
1.0000 mL | Freq: Once | INTRAMUSCULAR | Status: AC
  Administered 2019-11-04: 1 mL via EPIDURAL

## 2019-11-04 MED ORDER — METHYLPREDNISOLONE ACETATE 40 MG/ML INJ SUSP (RADIOLOG
120.0000 mg | Freq: Once | INTRAMUSCULAR | Status: AC
  Administered 2019-11-04: 120 mg via EPIDURAL

## 2019-11-04 NOTE — Discharge Instructions (Signed)

## 2019-11-05 ENCOUNTER — Other Ambulatory Visit: Payer: Self-pay

## 2021-05-31 ENCOUNTER — Encounter: Payer: Self-pay | Admitting: Emergency Medicine

## 2021-05-31 ENCOUNTER — Ambulatory Visit
Admission: EM | Admit: 2021-05-31 | Discharge: 2021-05-31 | Disposition: A | Discharge status: Home / Self Care | Payer: Self-pay | Attending: Internal Medicine | Admitting: Internal Medicine

## 2021-05-31 ENCOUNTER — Other Ambulatory Visit: Payer: Self-pay

## 2021-05-31 DIAGNOSIS — J069 Acute upper respiratory infection, unspecified: Secondary | ICD-10-CM

## 2021-05-31 DIAGNOSIS — R051 Acute cough: Secondary | ICD-10-CM

## 2021-05-31 MED ORDER — ALBUTEROL SULFATE HFA 108 (90 BASE) MCG/ACT IN AERS: 1.0000 | INHALATION_SPRAY | Freq: Four times a day (QID) | RESPIRATORY_TRACT | 0 refills | Status: DC | PRN

## 2021-05-31 MED ORDER — AMOXICILLIN-POT CLAVULANATE 875-125 MG PO TABS: 1.0000 | ORAL_TABLET | Freq: Two times a day (BID) | ORAL | 0 refills | Status: DC

## 2021-05-31 MED ORDER — GUAIFENESIN 200 MG PO TABS: 200.0000 mg | ORAL_TABLET | ORAL | 0 refills | Status: DC | PRN

## 2021-05-31 NOTE — ED Triage Notes (Signed)
Pt sts cough with congestion and thick mucous per pt x 5 days; denies fever; pt sts had covid in Jan

## 2021-05-31 NOTE — ED Provider Notes (Signed)
[facility]    CSN: [id number] Arrival date & time: 05/31/21  0855      History   Chief Complaint Chief Complaint  Patient presents with   Cough    HPI Michaela Parker is a 61 y.o. female.   Patient presents with nasal congestion and cough that has been present for approximately 7-8 days.  Patient reports that the mucus in her nose is very thick and it feels like she is coughing up mucus as well.  She has had episodes where it feels like she "cannot take a deep breath".  This is intermittent per patient.  Denies history of asthma or COPD and patient is not a smoker.  Patient has taken over-the-counter cold and flu medications with minimal improvement in symptoms.  Denies chest pain, sore throat, ear pain, nausea, vomiting, diarrhea, abdominal pain.   Cough  Past Medical History:  Diagnosis Date   Anxiety    Diabetes mellitus without complication [facility])     Patient Active Problem List   Diagnosis Date Noted   Diabetes mellitus ([place]) 09/07/2016   History of hypertension 09/07/2016    Past Surgical History:  Procedure Laterality Date   APPENDECTOMY     BREAST SURGERY     CESAREAN SECTION      OB History   No obstetric history on file.      Home Medications    Prior to Admission medications   Medication Sig Start Date End Date Taking? Authorizing Provider  albuterol (VENTOLIN HFA) 108 (90 Base) MCG/ACT inhaler Inhale 1-2 puffs into the lungs every 6 (six) hours as needed for wheezing or shortness of breath. 05/31/21  Yes Sha Burling, Hildred Alamin E, FNP  amoxicillin-clavulanate (AUGMENTIN) 875-125 MG tablet Take 1 tablet by mouth every 12 (twelve) hours. 05/31/21  Yes Rickia [place], Hildred Alamin E, FNP  guaiFENesin 200 MG tablet Take 1 tablet (200 mg total) by mouth every 4 (four) hours as needed for cough or to loosen phlegm. 05/31/21  Yes Lawana Hartzell, Hildred Alamin E, FNP  Ascorbic Acid (VITAMIN C) 1000 MG tablet Take 1,000 mg by mouth daily.    [provider]  Aspirin 81 MG EC tablet Take 81  mg by mouth daily.    [provider]  B Complex-C (SUPER B COMPLEX PO) Take 1 tablet by mouth daily.    [provider]  benazepril (LOTENSIN) 10 MG tablet Take 10 mg by mouth daily.    [provider]  Cholecalciferol (VITAMIN D3 PO) Take by mouth.    [provider]  gabapentin (NEURONTIN) 100 MG capsule 1 PO q HS, may increase to 3 PO TID if needed 08/28/19   Hilts, Michael, MD  HYDROcodone-acetaminophen (NORCO/VICODIN) 5-325 MG tablet Take 1 tablet by mouth 3 (three) times daily as needed for moderate pain. 09/16/19   Hilts, Legrand Como, MD  insulin NPH-regular Human (NOVOLIN 70/30) (70-30) 100 UNIT/ML injection Inject 0-85 Units into the skin 2 (two) times daily as needed (blood sugar reading).    [provider]  pravastatin (PRAVACHOL) 20 MG tablet Take 20 mg by mouth daily.    [provider]  traMADol (ULTRAM) 50 MG tablet Take 1 tablet (50 mg total) by mouth every 6 (six) hours as needed. 09/30/19   Hilts, Michael, MD  vitamin E 400 UNIT capsule Take 400 Units by mouth daily.    [provider]    Family History Family History  Problem Relation Age of Onset   COPD Mother    Cancer Father  Cancer Sister    Heart disease Brother     Social History Social History   Tobacco Use   Smoking status: Never  Substance Use Topics   Alcohol use: No   Drug use: No     Allergies   Patient has no known allergies.   Review of Systems Review of Systems Per HPI  Physical Exam Triage Vital Signs ED Triage Vitals  Enc Vitals Group     BP 05/31/21 0938 (!) 162/82     Pulse Rate 05/31/21 0938 87     Resp 05/31/21 0938 18     Temp 05/31/21 0938 98.1 F (36.7 C)     Temp Source 05/31/21 0938 Oral     SpO2 05/31/21 0938 96 %     Weight --      Height --      Head Circumference --      Peak Flow --      Pain Score 05/31/21 0939 0     Pain Loc --      Pain Edu? --      Excl. in [place]? --    No data found.  Updated  Vital Signs BP (!) 162/82 (BP Location: Left Arm)    Pulse 87    Temp 98.1 F (36.7 C) (Oral)    Resp 18    SpO2 96%   Visual Acuity Right Eye Distance:   Left Eye Distance:   Bilateral Distance:    Right Eye Near:   Left Eye Near:    Bilateral Near:     Physical Exam Constitutional:      General: She is not in acute distress.    Appearance: Normal appearance. She is not toxic-appearing or diaphoretic.  HENT:     Head: Normocephalic and atraumatic.     Right Ear: Tympanic membrane and ear canal normal.     Left Ear: Tympanic membrane and ear canal normal.     Nose: Congestion present.     Mouth/Throat:     Mouth: Mucous membranes are moist.     Pharynx: No posterior oropharyngeal erythema.  Eyes:     Extraocular Movements: Extraocular movements intact.     Conjunctiva/sclera: Conjunctivae normal.     Pupils: Pupils are equal, round, and reactive to light.  Cardiovascular:     Rate and Rhythm: Normal rate and regular rhythm.     Pulses: Normal pulses.     Heart sounds: Normal heart sounds.  Pulmonary:     Effort: Pulmonary effort is normal. No respiratory distress.     Breath sounds: Normal breath sounds. No stridor. No wheezing, rhonchi or rales.  Abdominal:     General: Abdomen is flat. Bowel sounds are normal.     Palpations: Abdomen is soft.  Musculoskeletal:        General: Normal range of motion.     Cervical back: Normal range of motion.  Skin:    General: Skin is warm and dry.  Neurological:     General: No focal deficit present.     Mental Status: She is alert and oriented to person, place, and time. Mental status is at baseline.  Psychiatric:        Mood and Affect: Mood normal.        Behavior: Behavior normal.     UC Treatments / Results  Labs (all labs ordered are listed, but only abnormal results are displayed) Labs Reviewed - No data to display  EKG   Radiology No results  found.  Procedures Procedures (including critical care  time)  Medications Ordered in UC Medications - No data to display  Initial Impression / Assessment and Plan / UC Course  I have reviewed the triage vital signs and the nursing notes.  Pertinent labs & imaging results that were available during my care of the patient were reviewed by me and considered in my medical decision making (see chart for details).     Due to reports of thick mucus and duration of symptoms, will treat with Augmentin antibiotic.  Suggested chest x-ray given patient's shortness of breath but patient declined.  Risks associated with not doing chest x-ray were discussed with patient.  Will prescribe albuterol inhaler to help alleviate intermittent shortness of breath.  Cough medication sent for patient as well.  Do not think that viral testing is necessary at this time given duration of symptoms.  Discussed strict return precautions.  Patient verbalized understanding and was agreeable with plan. Final Clinical Impressions(s) / UC Diagnoses   Final diagnoses:  Acute upper respiratory infection  Acute cough     Discharge Instructions      You are being treated with antibiotic, cough medication, albuterol inhaler.  Please take the albuterol inhaler as needed for shortness of breath.  Follow-up if symptoms persist or worsen.    ED Prescriptions     Medication Sig Dispense Auth. Provider   albuterol (VENTOLIN HFA) 108 (90 Base) MCG/ACT inhaler Inhale 1-2 puffs into the lungs every 6 (six) hours as needed for wheezing or shortness of breath. 1 each [place], Hildred Alamin E, FNP   guaiFENesin 200 MG tablet Take 1 tablet (200 mg total) by mouth every 4 (four) hours as needed for cough or to loosen phlegm. 30 suppository Blakley Michna, Conley E, [place]   amoxicillin-clavulanate (AUGMENTIN) 875-125 MG tablet Take 1 tablet by mouth every 12 (twelve) hours. 14 tablet [place], Michele Rockers, [place]      PDMP not reviewed this encounter.   Teodora Medici, [place] 05/31/21 1015

## 2021-05-31 NOTE — Discharge Instructions (Signed)
You are being treated with antibiotic, cough medication, albuterol inhaler.  Please take the albuterol inhaler as needed for shortness of breath.  Follow-up if symptoms persist or worsen.

## 2021-10-14 IMAGING — XA Imaging study
2 series · 2 of 2 positions shown · non-contrast
Comparison: none

CLINICAL DATA: Spondylosis without myelopathy. Good response to the
previous L4-5 epidural injection but with some persistent ongoing
back and leg pain.

[Series 1: ortho adipose · 1 of 1 slices shown (1 of 2)]
[im 1/1]
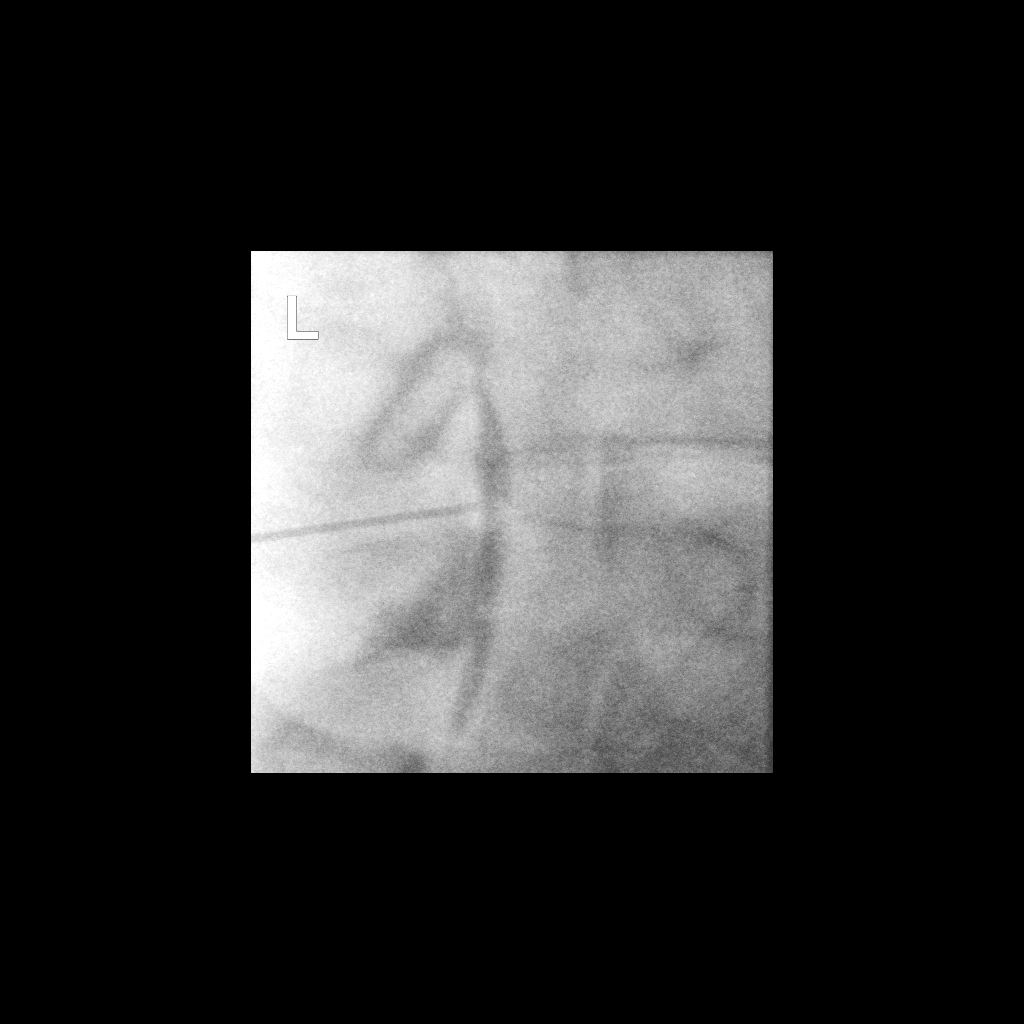

[Series 2: ortho adipose · 1 of 1 slices shown (2 of 2)]
[im 1/1]
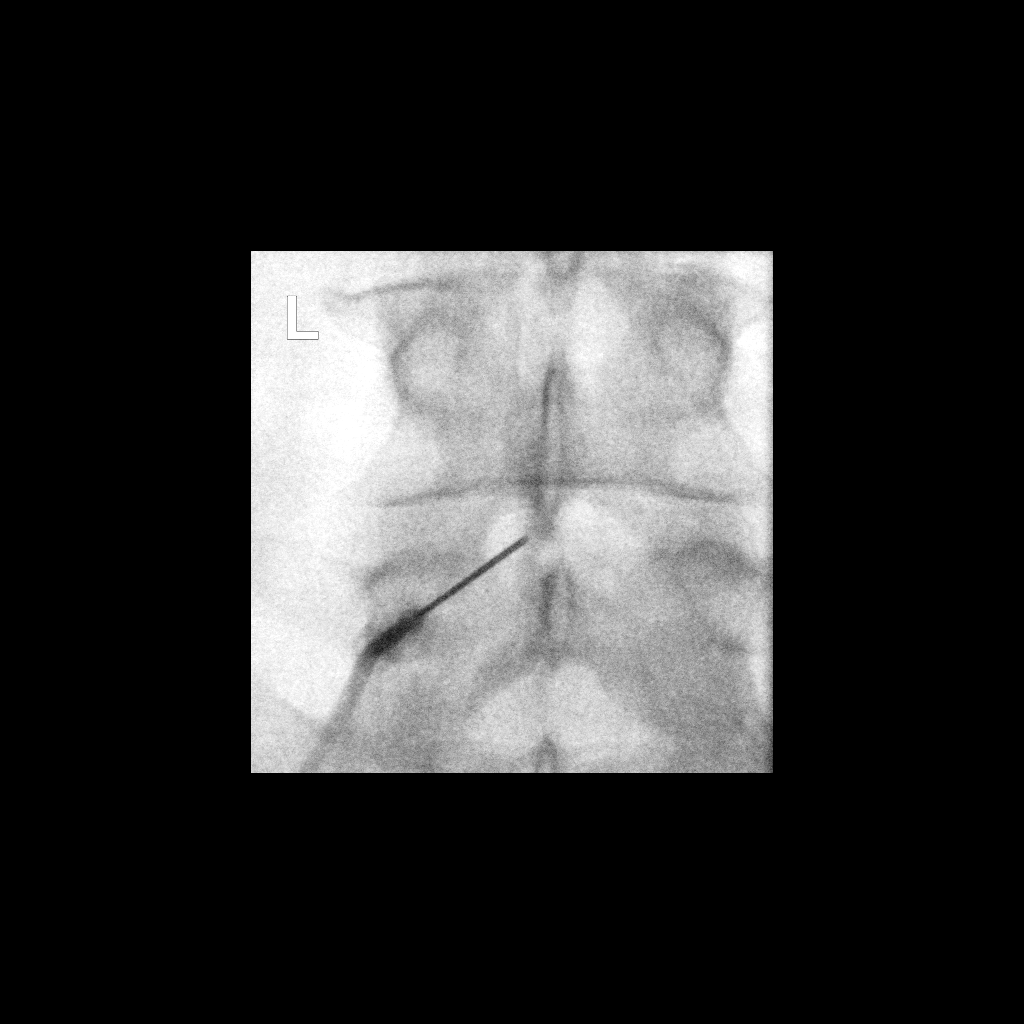

[2 of 2 positions shown; findings below may reference images not displayed]

FLUOROSCOPY TIME:  0 minutes 28 seconds. 37.36 micro gray meter
squared

PROCEDURE:
The procedure, risks, benefits, and alternatives were explained to
the patient. Questions regarding the procedure were encouraged and
answered. The patient understands and consents to the procedure.

LUMBAR EPIDURAL INJECTION:

An interlaminar approach was performed on the left at L4-5. The
overlying skin was cleansed and anesthetized. A 20 gauge epidural
needle was advanced using loss-of-resistance technique.

DIAGNOSTIC EPIDURAL INJECTION:

Injection of Isovue-M 200 shows a good epidural pattern with spread
above and below the level of needle placement, primarily on the
left, but to both sides. No vascular opacification is seen.

THERAPEUTIC EPIDURAL INJECTION:

One hundred twenty mg of Depo-Medrol mixed with 2.5 cc 1% lidocaine
were instilled. The procedure was well-tolerated, and the patient
was discharged thirty minutes following the injection in good
condition.

COMPLICATIONS:
None
IMPRESSION: Technically successful epidural injection on the left at L4-5 # 2.

## 2022-03-21 DIAGNOSIS — N390 Urinary tract infection, site not specified: Secondary | ICD-10-CM | POA: Diagnosis not present

## 2022-03-21 DIAGNOSIS — R739 Hyperglycemia, unspecified: Secondary | ICD-10-CM | POA: Diagnosis not present

## 2022-04-18 DIAGNOSIS — J189 Pneumonia, unspecified organism: Secondary | ICD-10-CM | POA: Diagnosis not present

## 2022-07-17 DIAGNOSIS — N390 Urinary tract infection, site not specified: Secondary | ICD-10-CM | POA: Diagnosis not present

## 2022-12-08 ENCOUNTER — Ambulatory Visit
Admission: EM | Admit: 2022-12-08 | Discharge: 2022-12-08 | Disposition: A | Discharge status: Home / Self Care | Payer: 59 | Attending: Physician Assistant | Admitting: Physician Assistant

## 2022-12-08 DIAGNOSIS — J329 Chronic sinusitis, unspecified: Secondary | ICD-10-CM | POA: Diagnosis not present

## 2022-12-08 DIAGNOSIS — J40 Bronchitis, not specified as acute or chronic: Secondary | ICD-10-CM | POA: Diagnosis not present

## 2022-12-08 DIAGNOSIS — Z1152 Encounter for screening for COVID-19: Secondary | ICD-10-CM | POA: Insufficient documentation

## 2022-12-08 MED ORDER — LEVOFLOXACIN 500 MG PO TABS: 500.0000 mg | ORAL_TABLET | Freq: Every day | ORAL | 0 refills | Status: DC

## 2022-12-08 NOTE — ED Triage Notes (Signed)
"  I have been feeling bad all week this week, I feel like it is a sinus infection". Concerns for "Pneumonia". Upcoming Trip.

## 2022-12-08 NOTE — ED Provider Notes (Signed)
[facility]    CSN: [id number] Arrival date & time: 12/08/22  3664      History   Chief Complaint Chief Complaint  Patient presents with   Sinus Problems    HPI Michaela Parker is a 62 y.o. female.   Patient here today for evaluation of sinus congestion and cough that has been ongoing for the last week.  She states she has an upcoming trip and is concerned to travel.  She notes that in the past she has done the same and has developed pneumonia.  She has tried over-the-counter medications without resolution.  She denies any vomiting or diarrhea.  Patient reports that typically when she has this type of respiratory illness including pneumonia she requires Levaquin to clear.  The history is provided by the patient.    Past Medical History:  Diagnosis Date   Anxiety    Diabetes mellitus without complication [facility])     Patient Active Problem List   Diagnosis Date Noted   Diabetes mellitus (HCC) 09/07/2016   History of hypertension 09/07/2016    Past Surgical History:  Procedure Laterality Date   APPENDECTOMY     BREAST SURGERY     CESAREAN SECTION      OB History   No obstetric history on file.      Home Medications    Prior to Admission medications   Medication Sig Start Date End Date Taking? Authorizing Provider  Ascorbic Acid (VITAMIN C) 1000 MG tablet Take 1,000 mg by mouth daily.   Yes [provider]  Aspirin 81 MG EC tablet Take 81 mg by mouth daily.   Yes [provider]  B Complex-C (SUPER B COMPLEX PO) Take 1 tablet by mouth daily.   Yes [provider]  benazepril (LOTENSIN) 10 MG tablet Take 10 mg by mouth daily.   Yes [provider]  Cholecalciferol (VITAMIN D3 PO) Take by mouth.   Yes [provider]  insulin NPH-regular Human (NOVOLIN 70/30) (70-30) 100 UNIT/ML injection Inject 0-85 Units into the skin 2 (two) times daily as needed (blood sugar reading).   Yes [provider]   levofloxacin (LEVAQUIN) 500 MG tablet Take 1 tablet (500 mg total) by mouth daily. 12/08/22  Yes Tomi Bamberger, PA-C  pravastatin (PRAVACHOL) 20 MG tablet Take 20 mg by mouth daily.   Yes [provider]  albuterol (VENTOLIN HFA) 108 (90 Base) MCG/ACT inhaler Inhale 1-2 puffs into the lungs every 6 (six) hours as needed for wheezing or shortness of breath. 05/31/21   Gustavus Bryant, FNP  guaiFENesin 200 MG tablet Take 1 tablet (200 mg total) by mouth every 4 (four) hours as needed for cough or to loosen phlegm. 05/31/21   Gustavus Bryant, FNP  HYDROcodone-acetaminophen (NORCO/VICODIN) 5-325 MG tablet Take 1 tablet by mouth 3 (three) times daily as needed for moderate pain. 09/16/19   Hilts, Casimiro Needle, MD  traMADol (ULTRAM) 50 MG tablet Take 1 tablet (50 mg total) by mouth every 6 (six) hours as needed. 09/30/19   Hilts, Michael, MD  vitamin E 400 UNIT capsule Take 400 Units by mouth daily.    [provider]    Family History Family History  Problem Relation Age of Onset   COPD Mother    Cancer Father    Cancer Sister    Heart disease Brother     Social History Social History   Tobacco Use   Smoking status: Never   Smokeless tobacco: Never  Vaping Use   Vaping status: Never Used  Substance Use Topics   Alcohol use: No   Drug use: No     Allergies   Patient has no known allergies.   Review of Systems Review of Systems  Constitutional:  Negative for chills and fever.  HENT:  Positive for congestion. Negative for ear pain and sore throat.   Eyes:  Negative for discharge and redness.  Respiratory:  Positive for cough. Negative for shortness of breath and wheezing.   Gastrointestinal:  Negative for abdominal pain, diarrhea, nausea and vomiting.     Physical Exam Triage Vital Signs ED Triage Vitals  Encounter Vitals Group     BP 12/08/22 0924 136/81     Systolic BP Percentile --      Diastolic BP Percentile --      Pulse Rate 12/08/22 0924 90     Resp  12/08/22 0924 18     Temp 12/08/22 0924 98.7 F (37.1 C)     Temp Source 12/08/22 0924 Oral     SpO2 12/08/22 0924 95 %     Weight 12/08/22 0920 220 lb (99.8 kg)     Height 12/08/22 0920 5\' 3"  (1.6 m)     Head Circumference --      Peak Flow --      Pain Score 12/08/22 0918 0     Pain Loc --      Pain Education --      Exclude from Growth Chart --    No data found.  Updated Vital Signs BP 136/81 (BP Location: Left Arm)   Pulse 90   Temp 98.7 F (37.1 C) (Oral)   Resp 18   Ht 5\' 3"  (1.6 m)   Wt 220 lb (99.8 kg)   SpO2 95%   BMI 38.97 kg/m     Physical Exam Vitals and nursing note reviewed.  Constitutional:      General: She is not in acute distress.    Appearance: Normal appearance. She is not ill-appearing.  HENT:     Head: Normocephalic and atraumatic.     Nose: Congestion present.     Mouth/Throat:     Mouth: Mucous membranes are moist.     Pharynx: No oropharyngeal exudate or posterior oropharyngeal erythema.  Eyes:     Conjunctiva/sclera: Conjunctivae normal.  Cardiovascular:     Rate and Rhythm: Normal rate and regular rhythm.     Heart sounds: Normal heart sounds. No murmur heard. Pulmonary:     Effort: Pulmonary effort is normal. No respiratory distress.     Breath sounds: Normal breath sounds. No wheezing, rhonchi or rales.  Skin:    General: Skin is warm and dry.  Neurological:     Mental Status: She is alert.  Psychiatric:        Mood and Affect: Mood normal.        Thought Content: Thought content normal.      UC Treatments / Results  Labs (all labs ordered are listed, but only abnormal results are displayed) Labs Reviewed  SARS CORONAVIRUS 2 (TAT 6-24 HRS)    EKG   Radiology No results found.  Procedures Procedures (including critical care time)  Medications Ordered in UC Medications - No data to display  Initial Impression / Assessment and Plan / UC Course  I have reviewed the triage vital signs and the nursing  notes.  Pertinent labs & imaging results that were available during my care of the patient were reviewed  by me and considered in my medical decision making (see chart for details).    COVID screening ordered.  Will treat with Levaquin to cover possible sinobronchitis/early pneumonia given history.  Encouraged follow-up if no gradual improvement with any further concerns.  Final Clinical Impressions(s) / UC Diagnoses   Final diagnoses:  Sinobronchitis   Discharge Instructions   None    ED Prescriptions     Medication Sig Dispense Auth. Provider   levofloxacin (LEVAQUIN) 500 MG tablet Take 1 tablet (500 mg total) by mouth daily. 7 tablet Tomi Bamberger, PA-C      PDMP not reviewed this encounter.   Tomi Bamberger, PA-C 12/08/22 1314

## 2022-12-09 LAB — SARS CORONAVIRUS 2 (TAT 6-24 HRS): SARS Coronavirus 2: NEGATIVE

## 2023-02-16 ENCOUNTER — Ambulatory Visit
Admission: RE | Admit: 2023-02-16 | Discharge: 2023-02-16 | Disposition: A | Discharge status: Home / Self Care | Payer: 59 | Source: Ambulatory Visit

## 2023-02-16 VITALS — BP 147/80 | HR 92 | Temp 98.3°F | Resp 18 | Ht 63.0 in | Wt 225.0 lb

## 2023-02-16 DIAGNOSIS — M7989 Other specified soft tissue disorders: Secondary | ICD-10-CM

## 2023-02-16 NOTE — ED Triage Notes (Signed)
Have gained a lb a day this week. - Entered by patient  "I am a diabetic, I don't normally have swelling but recently feeling tightness in lower legs above ankles with possible swelling". "My daughter is a Engineer, civil (consulting) and recommended Urgent Care because I am changing providers currently".

## 2023-02-17 LAB — COMPREHENSIVE METABOLIC PANEL
ALT: 23 [IU]/L (ref 0–32)
AST: 21 [IU]/L (ref 0–40)
Albumin: 4.6 g/dL (ref 3.9–4.9)
Alkaline Phosphatase: 69 [IU]/L (ref 44–121)
BUN/Creatinine Ratio: 17 (ref 12–28)
BUN: 10 mg/dL (ref 8–27)
Bilirubin Total: 0.5 mg/dL (ref 0.0–1.2)
CO2: 21 mmol/L (ref 20–29)
Calcium: 9.4 mg/dL (ref 8.7–10.3)
Chloride: 100 mmol/L (ref 96–106)
Creatinine, Ser: 0.6 mg/dL (ref 0.57–1.00)
Globulin, Total: 2.2 g/dL (ref 1.5–4.5)
Glucose: 237 mg/dL — ABNORMAL HIGH (ref 70–99)
Potassium: 3.9 mmol/L (ref 3.5–5.2)
Sodium: 139 mmol/L (ref 134–144)
Total Protein: 6.8 g/dL (ref 6.0–8.5)
eGFR: 101 mL/min/{1.73_m2} (ref >59)

## 2023-02-17 LAB — CBC WITH DIFFERENTIAL/PLATELET
Basophils Absolute: 0 10*3/uL (ref 0.0–0.2)
Basos: 1 %
EOS (ABSOLUTE): 0.1 10*3/uL (ref 0.0–0.4)
Eos: 2 %
Hematocrit: 47.7 % — ABNORMAL HIGH (ref 34.0–46.6)
Hemoglobin: 15.2 g/dL (ref 11.1–15.9)
Immature Grans (Abs): 0 10*3/uL (ref 0.0–0.1)
Immature Granulocytes: 0 %
Lymphocytes Absolute: 3 10*3/uL (ref 0.7–3.1)
Lymphs: 34 %
MCH: 30.2 pg (ref 26.6–33.0)
MCHC: 31.9 g/dL (ref 31.5–35.7)
MCV: 95 fL (ref 79–97)
Monocytes Absolute: 0.6 10*3/uL (ref 0.1–0.9)
Monocytes: 7 %
Neutrophils Absolute: 5 10*3/uL (ref 1.4–7.0)
Neutrophils: 56 %
Platelets: 225 10*3/uL (ref 150–450)
RBC: 5.04 x10E6/uL (ref 3.77–5.28)
RDW: 12.3 % (ref 11.7–15.4)
WBC: 8.8 10*3/uL (ref 3.4–10.8)

## 2023-02-25 ENCOUNTER — Ambulatory Visit
Admission: RE | Admit: 2023-02-25 | Discharge: 2023-02-25 | Disposition: A | Discharge status: Home / Self Care | Payer: 59 | Source: Ambulatory Visit | Attending: Internal Medicine | Admitting: Internal Medicine

## 2023-02-25 VITALS — BP 140/79 | HR 87 | Temp 98.3°F | Ht 62.2 in | Wt 222.0 lb

## 2023-02-25 DIAGNOSIS — S0501XA Injury of conjunctiva and corneal abrasion without foreign body, right eye, initial encounter: Secondary | ICD-10-CM | POA: Diagnosis not present

## 2023-02-25 MED ORDER — ERYTHROMYCIN 5 MG/GM OP OINT: TOPICAL_OINTMENT | OPHTHALMIC | 0 refills | Status: DC

## 2023-02-25 NOTE — Discharge Instructions (Signed)
You have an abrasion of your cornea of your eye.  I have prescribed antibiotic ointment to apply to area.  Follow-up with eye doctor if symptoms persist or worsen.

## 2023-02-25 NOTE — ED Triage Notes (Signed)
Patient states her dog scratched her right eye late Thursday night, she is having tearful drainage with movement. Patient states it is sensitive to light, not painful. "It feels like a scratch. Patient states she brought sensitive eyes by [facility] and used Friday only.

## 2023-02-25 NOTE — ED Provider Notes (Addendum)
[facility]    CSN: [id number] Arrival date & time: 02/25/23  1248      History   Chief Complaint Chief Complaint  Patient presents with   Eye Problem    Dog scratched eye. - Entered by patient    HPI Michaela Parker is a 62 y.o. female.   Patient present with concern for scratch to right eye that occurred about 4 days ago.  Reports that her poodle dog jumped up and accidentally scratched her right eye with his nail.  Pain has resolved but she does still feel a little bit of irritation.  Denies blurry vision.  Last tetanus vaccination was within 2 years.  She does not wear contacts or glasses.   Eye Problem   Past Medical History:  Diagnosis Date   Anxiety    Diabetes mellitus without complication [facility])     Patient Active Problem List   Diagnosis Date Noted   Diabetes mellitus (HCC) 09/07/2016   History of hypertension 09/07/2016    Past Surgical History:  Procedure Laterality Date   APPENDECTOMY     BREAST SURGERY     CESAREAN SECTION      OB History   No obstetric history on file.      Home Medications    Prior to Admission medications   Medication Sig Start Date End Date Taking? Authorizing Provider  erythromycin ophthalmic ointment Place a 1/2 inch ribbon of ointment into the lower eyelid 4 times daily for 7 days. 02/25/23  Yes Ruchel Brandenburger, Acie Fredrickson, FNP  albuterol (VENTOLIN HFA) 108 (90 Base) MCG/ACT inhaler Inhale 1-2 puffs into the lungs every 6 (six) hours as needed for wheezing or shortness of breath. 05/31/21   Gustavus Bryant, FNP  amoxicillin-clavulanate (AUGMENTIN) 875-125 MG tablet Take 1 tablet by mouth 2 (two) times daily.    [provider]  Amoxicillin-Pot Clavulanate (AUGMENTIN PO) Take 875 mg by mouth 2 (two) times daily. 09/07/16   [provider]  Ascorbic Acid (VITAMIN C) 1000 MG tablet Take 1,000 mg by mouth daily.    [provider]  Aspirin 81 MG EC tablet Take 81 mg by mouth daily.    [provider]  aspirin EC 81 MG tablet Take 81 mg by mouth daily. 09/11/12   [provider]  B Complex-C (SUPER B COMPLEX PO) Take 1 tablet by mouth daily.    [provider]  benazepril (LOTENSIN) 10 MG tablet Take 10 mg by mouth daily.    [provider]  benazepril (LOTENSIN) 20 MG tablet Take 20 mg by mouth daily.    [provider]  chlorpheniramine-HYDROcodone (TUSSIONEX) 10-8 MG/5ML Take 5 mLs by mouth every 12 (twelve) hours as needed for cough. 09/07/16   [provider]  Cholecalciferol (VITAMIN D3 PO) Take by mouth.    [provider]  fluconazole (DIFLUCAN) 150 MG tablet Take 150 mg by mouth once. 09/20/16   [provider]  guaiFENesin 200 MG tablet Take 1 tablet (200 mg total) by mouth every 4 (four) hours as needed for cough or to loosen phlegm. 05/31/21   Gustavus Bryant, FNP  HYDROcodone-acetaminophen (NORCO/VICODIN) 5-325 MG tablet Take 1 tablet by mouth 3 (three) times daily as needed for moderate pain. 09/16/19   Hilts, Casimiro Needle, MD  insulin lispro protamine-lispro (HUMALOG MIX 75/25) (75-25) 100 UNIT/ML SUSP injection Inject 80 Units into the skin 2 (two) times daily with a meal. 09/11/12   [provider]  insulin NPH-regular  Human (NOVOLIN 70/30) (70-30) 100 UNIT/ML injection Inject 0-85 Units into the skin 2 (two) times daily as needed (blood sugar reading). Last taken: 1045am. Glucose: 209    [provider]  levofloxacin (LEVAQUIN) 500 MG tablet Take 1 tablet (500 mg total) by mouth daily. 12/08/22   Tomi Bamberger, PA-C  linaGLIPtin-metFORMIN HCl (JENTADUETO) 2.08-998 MG TABS Take 1 tablet by mouth daily. 09/11/12   [provider]  Multiple Vitamin (MULTIVITAMIN PO) Take 1 tablet by mouth daily. 09/11/12   [provider]  pravastatin (PRAVACHOL) 20 MG tablet Take 20 mg by mouth daily.    [provider]  traMADol (ULTRAM) 50 MG tablet Take 1 tablet (50 mg total) by mouth every 6 (six)  hours as needed. 09/30/19   Hilts, Michael, MD  vitamin E 400 UNIT capsule Take 400 Units by mouth daily.    [provider]    Family History Family History  Problem Relation Age of Onset   COPD Mother    Cancer Father    Cancer Sister    Heart disease Brother     Social History Social History   Tobacco Use   Smoking status: Never   Smokeless tobacco: Never  Vaping Use   Vaping status: Never Used  Substance Use Topics   Alcohol use: Yes    Comment: Occassionally.   Drug use: Never     Allergies   Patient has no known allergies.   Review of Systems Review of Systems Per HPI  Physical Exam Triage Vital Signs ED Triage Vitals  Encounter Vitals Group     BP 02/25/23 1303 (!) 140/79     Systolic BP Percentile --      Diastolic BP Percentile --      Pulse Rate 02/25/23 1303 87     Resp --      Temp 02/25/23 1303 98.3 F (36.8 C)     Temp Source 02/25/23 1303 Oral     SpO2 02/25/23 1303 98 %     Weight 02/25/23 1302 222 lb (100.7 kg)     Height 02/25/23 1302 5' 2.2" (1.58 m)     Head Circumference --      Peak Flow --      Pain Score 02/25/23 1302 0     Pain Loc --      Pain Education --      Exclude from Growth Chart --    No data found.  Updated Vital Signs BP (!) 140/79 (BP Location: Left Arm)   Pulse 87   Temp 98.3 F (36.8 C) (Oral)   Ht 5' 2.2" (1.58 m)   Wt 222 lb (100.7 kg)   SpO2 98%   BMI 40.34 kg/m   Visual Acuity Right Eye Distance: 20/70 Left Eye Distance: 20/40 Bilateral Distance: 20/30  Right Eye Near:   Left Eye Near:    Bilateral Near:     Physical Exam Constitutional:      General: She is not in acute distress.    Appearance: Normal appearance. She is not toxic-appearing or diaphoretic.  HENT:     Head: Normocephalic and atraumatic.  Eyes:     General: Lids are normal. Lids are everted, no foreign bodies appreciated. Vision grossly intact. Gaze aligned appropriately.     Extraocular Movements: Extraocular  movements intact.     Conjunctiva/sclera: Conjunctivae normal.     Pupils:     Right eye: Corneal abrasion and fluorescein uptake present.  Comments: Fluorescein reuptake notable for corneal abrasion present to right eye. Eyelids appear normal.   Pulmonary:     Effort: Pulmonary effort is normal.  Neurological:     General: No focal deficit present.     Mental Status: She is alert and oriented to person, place, and time. Mental status is at baseline.  Psychiatric:        Mood and Affect: Mood normal.        Behavior: Behavior normal.        Thought Content: Thought content normal.        Judgment: Judgment normal.      UC Treatments / Results  Labs (all labs ordered are listed, but only abnormal results are displayed) Labs Reviewed - No data to display  EKG   Radiology No results found.  Procedures Procedures (including critical care time)  Medications Ordered in UC Medications - No data to display  Initial Impression / Assessment and Plan / UC Course  I have reviewed the triage vital signs and the nursing notes.  Pertinent labs & imaging results that were available during my care of the patient were reviewed by me and considered in my medical decision making (see chart for details).     Patient has corneal abrasion to right eye on exam.  Will treat with erythromycin ointment.  Visual acuity screening slightly abnormal in the right eye with test but patient is denying any blurry vision so do not think that emergent evaluation is necessary. Advised patient of strict follow-up with provided contact information for ophthalmology if symptoms persist or worsen.  Tetanus vaccine up-to-date.  Patient verbalized understanding and was agreeable with plan. Final Clinical Impressions(s) / UC Diagnoses   Final diagnoses:  Abrasion of right cornea, initial encounter     Discharge Instructions      You have an abrasion of your cornea of your eye.  I have prescribed  antibiotic ointment to apply to area.  Follow-up with eye doctor if symptoms persist or worsen.    ED Prescriptions     Medication Sig Dispense Auth. Provider   erythromycin ophthalmic ointment Place a 1/2 inch ribbon of ointment into the lower eyelid 4 times daily for 7 days. 3.5 g Gustavus Bryant, [place]      PDMP not reviewed this encounter.   Gustavus Bryant, [place] 02/25/23 1328    Gustavus Bryant, [place] 02/25/23 1349

## 2023-03-11 DIAGNOSIS — N3000 Acute cystitis without hematuria: Secondary | ICD-10-CM | POA: Insufficient documentation

## 2023-03-17 NOTE — ED Provider Notes (Signed)
[facility]    CSN: [id number] Arrival date & time: 02/16/23  1113      History   Chief Complaint Chief Complaint  Patient presents with   Leg Swelling    Appt    HPI Michaela Parker is a 62 y.o. female.   Patient here today for evaluation of weight gain that she has had recently.  She reports she has gained approximately a pound per day over the last week.  She states she is a diabetic and she does not normally have swelling however she has noticed some tightness in her lower legs and has had some swelling present as well.  She reports her daughter is a Engineer, civil (consulting) and recommended she come to urgent care as she is in between PCPs at this time.  She does not report any chest pain or shortness of breath.  She has not had fever.  There is no redness associated with swelling.  The history is provided by the patient.    Past Medical History:  Diagnosis Date   Anxiety    Diabetes mellitus without complication [facility])     Patient Active Problem List   Diagnosis Date Noted   Diabetes mellitus (HCC) 09/07/2016   History of hypertension 09/07/2016    Past Surgical History:  Procedure Laterality Date   APPENDECTOMY     BREAST SURGERY     CESAREAN SECTION      OB History   No obstetric history on file.      Home Medications    Prior to Admission medications   Medication Sig Start Date End Date Taking? Authorizing Provider  Amoxicillin-Pot Clavulanate (AUGMENTIN PO) Take 875 mg by mouth 2 (two) times daily. 09/07/16  Yes [provider]  aspirin EC 81 MG tablet Take 81 mg by mouth daily. 09/11/12  Yes [provider]  benazepril (LOTENSIN) 10 MG tablet Take 10 mg by mouth daily.   Yes [provider]  chlorpheniramine-HYDROcodone (TUSSIONEX) 10-8 MG/5ML Take 5 mLs by mouth every 12 (twelve) hours as needed for cough. 09/07/16  Yes [provider]  fluconazole (DIFLUCAN) 150 MG tablet Take 150 mg by mouth once. 09/20/16  Yes [provider]  insulin lispro protamine-lispro (HUMALOG MIX 75/25) (75-25) 100 UNIT/ML SUSP injection Inject 80 Units into the skin 2 (two) times daily with a meal. 09/11/12  Yes [provider]  insulin NPH-regular Human (NOVOLIN 70/30) (70-30) 100 UNIT/ML injection Inject 0-85 Units into the skin 2 (two) times daily as needed (blood sugar reading). Last taken: 1045am. Glucose: 209   Yes [provider]  linaGLIPtin-metFORMIN HCl (JENTADUETO) 2.08-998 MG TABS Take 1 tablet by mouth daily. 09/11/12  Yes [provider]  Multiple Vitamin (MULTIVITAMIN PO) Take 1 tablet by mouth daily. 09/11/12  Yes [provider]  albuterol (VENTOLIN HFA) 108 (90 Base) MCG/ACT inhaler Inhale 1-2 puffs into the lungs every 6 (six) hours as needed for wheezing or shortness of breath. 05/31/21   Gustavus Bryant, FNP  amoxicillin-clavulanate (AUGMENTIN) 875-125 MG tablet Take 1 tablet by mouth 2 (two) times daily.    [provider]  Ascorbic Acid (VITAMIN C) 1000 MG tablet Take 1,000 mg by mouth daily.    [provider]  Aspirin 81 MG EC tablet Take 81 mg by mouth daily.    [provider]  B Complex-C (SUPER B COMPLEX PO) Take 1 tablet by mouth daily.    [provider]  benazepril (LOTENSIN) 20 MG tablet  Take 20 mg by mouth daily.    [provider]  Cholecalciferol (VITAMIN D3 PO) Take by mouth.    [provider]  erythromycin ophthalmic ointment Place a 1/2 inch ribbon of ointment into the lower eyelid 4 times daily for 7 days. 02/25/23   Gustavus Bryant, FNP  guaiFENesin 200 MG tablet Take 1 tablet (200 mg total) by mouth every 4 (four) hours as needed for cough or to loosen phlegm. 05/31/21   Gustavus Bryant, FNP  HYDROcodone-acetaminophen (NORCO/VICODIN) 5-325 MG tablet Take 1 tablet by mouth 3 (three) times daily as needed for moderate pain. 09/16/19   Hilts, Casimiro Needle, MD  levofloxacin (LEVAQUIN) 500 MG tablet Take 1 tablet (500 mg  total) by mouth daily. 12/08/22   Tomi Bamberger, PA-C  pravastatin (PRAVACHOL) 20 MG tablet Take 20 mg by mouth daily.    [provider]  traMADol (ULTRAM) 50 MG tablet Take 1 tablet (50 mg total) by mouth every 6 (six) hours as needed. 09/30/19   Hilts, Michael, MD  vitamin E 400 UNIT capsule Take 400 Units by mouth daily.    [provider]    Family History Family History  Problem Relation Age of Onset   COPD Mother    Cancer Father    Cancer Sister    Heart disease Brother     Social History Social History   Tobacco Use   Smoking status: Never   Smokeless tobacco: Never  Vaping Use   Vaping status: Never Used  Substance Use Topics   Alcohol use: Yes    Comment: Occassionally.   Drug use: Never     Allergies   Patient has no known allergies.   Review of Systems Review of Systems  Constitutional:  Negative for chills and fever.  Eyes:  Negative for discharge and redness.  Respiratory:  Negative for shortness of breath.   Cardiovascular:  Positive for leg swelling. Negative for chest pain.  Gastrointestinal:  Negative for nausea and vomiting.  Neurological:  Negative for light-headedness and headaches.     Physical Exam Triage Vital Signs ED Triage Vitals  Encounter Vitals Group     BP 02/16/23 1139 (!) 156/83     Systolic BP Percentile --      Diastolic BP Percentile --      Pulse Rate 02/16/23 1139 92     Resp 02/16/23 1139 18     Temp 02/16/23 1139 98.3 F (36.8 C)     Temp Source 02/16/23 1139 Oral     SpO2 02/16/23 1139 96 %     Weight 02/16/23 1144 225 lb (102.1 kg)     Height 02/16/23 1144 5\' 3"  (1.6 m)     Head Circumference --      Peak Flow --      Pain Score 02/16/23 1140 0     Pain Loc --      Pain Education --      Exclude from Growth Chart --    No data found.  Updated Vital Signs BP (!) 147/80 (BP Location: Left Arm)   Pulse 92   Temp 98.3 F (36.8 C) (Oral)   Resp 18   Ht 5\' 3"  (1.6 m)   Wt 225 lb (102.1  kg)   SpO2 97%   BMI 39.86 kg/m   Visual Acuity Right Eye Distance:   Left Eye Distance:   Bilateral Distance:    Right Eye Near:   Left Eye Near:  Bilateral Near:     Physical Exam Vitals and nursing note reviewed.  Constitutional:      General: She is not in acute distress.    Appearance: Normal appearance. She is not ill-appearing.  HENT:     Head: Normocephalic and atraumatic.  Eyes:     Conjunctiva/sclera: Conjunctivae normal.  Cardiovascular:     Rate and Rhythm: Normal rate and regular rhythm.  Pulmonary:     Effort: Pulmonary effort is normal. No respiratory distress.     Breath sounds: No wheezing, rhonchi or rales.  Musculoskeletal:     Comments: Trace edema to bilateral lower extremities, no erythema, no tenderness to palpation to bilateral lower legs  Neurological:     Mental Status: She is alert.  Psychiatric:        Mood and Affect: Mood normal.        Behavior: Behavior normal.        Thought Content: Thought content normal.      UC Treatments / Results  Labs (all labs ordered are listed, but only abnormal results are displayed) Labs Reviewed  COMPREHENSIVE METABOLIC PANEL - Abnormal; Notable for the following components:      Result Value   Glucose 237 (*)    All other components within normal limits   Narrative:    Performed at:  [address], [place], [place]  [id number] Lab Director: Jolene Schimke MD, Phone:  [phone]  CBC WITH DIFFERENTIAL/PLATELET - Abnormal; Notable for the following components:   Hematocrit 47.7 (*)    All other components within normal limits   Narrative:    Performed at:  [address] [place], [place]  [id number] Lab Director: Jolene Schimke MD, Phone:  [phone]  TSH    EKG   Radiology No results found.  Procedures Procedures (including critical care time)  Medications Ordered in UC Medications - No data to display  Initial Impression /  Assessment and Plan / UC Course  I have reviewed the triage vital signs and the nursing notes.  Pertinent labs & imaging results that were available during my care of the patient were reviewed by me and considered in my medical decision making (see chart for details).    Routine labs ordered.  Will await results further recommendation.  Advised further evaluation in the emergency room with any worsening while awaiting results.  Patient expresses understanding.  Final Clinical Impressions(s) / UC Diagnoses   Final diagnoses:  Leg swelling   Discharge Instructions   None    ED Prescriptions   None    PDMP not reviewed this encounter.   Tomi Bamberger, PA-C 03/17/23 [phone]

## 2023-06-20 ENCOUNTER — Ambulatory Visit (INDEPENDENT_AMBULATORY_CARE_PROVIDER_SITE_OTHER): Payer: 59

## 2023-06-20 ENCOUNTER — Ambulatory Visit
Admission: RE | Admit: 2023-06-20 | Discharge: 2023-06-20 | Disposition: A | Discharge status: Home / Self Care | Payer: 59 | Source: Ambulatory Visit | Attending: Family Medicine | Admitting: Family Medicine

## 2023-06-20 ENCOUNTER — Encounter: Payer: Self-pay | Admitting: Family Medicine

## 2023-06-20 VITALS — BP 145/69 | HR 112 | Temp 98.7°F | Resp 18

## 2023-06-20 DIAGNOSIS — N390 Urinary tract infection, site not specified: Secondary | ICD-10-CM

## 2023-06-20 DIAGNOSIS — R0602 Shortness of breath: Secondary | ICD-10-CM

## 2023-06-20 DIAGNOSIS — J101 Influenza due to other identified influenza virus with other respiratory manifestations: Secondary | ICD-10-CM | POA: Diagnosis not present

## 2023-06-20 DIAGNOSIS — Z794 Long term (current) use of insulin: Secondary | ICD-10-CM | POA: Diagnosis not present

## 2023-06-20 DIAGNOSIS — E1165 Type 2 diabetes mellitus with hyperglycemia: Secondary | ICD-10-CM | POA: Diagnosis not present

## 2023-06-20 DIAGNOSIS — R059 Cough, unspecified: Secondary | ICD-10-CM | POA: Diagnosis not present

## 2023-06-20 LAB — POC COVID19/FLU A&B COMBO
Covid Antigen, POC: NEGATIVE
Influenza A Antigen, POC: POSITIVE — AB
Influenza B Antigen, POC: NEGATIVE

## 2023-06-20 LAB — POCT URINALYSIS DIP (MANUAL ENTRY)
Bilirubin, UA: NEGATIVE
Glucose, UA: 500 mg/dL — AB
Ketones, POC UA: NEGATIVE mg/dL
Nitrite, UA: NEGATIVE
Protein Ur, POC: NEGATIVE mg/dL
Spec Grav, UA: <=1.005 — AB (ref 1.010–1.025)
Urobilinogen, UA: 0.2 U/dL
pH, UA: 6 (ref 5.0–8.0)

## 2023-06-20 LAB — POCT FASTING CBG KUC MANUAL ENTRY: POCT Glucose (KUC): 376 mg/dL — AB (ref 70–99)

## 2023-06-20 MED ORDER — AMOXICILLIN-POT CLAVULANATE 875-125 MG PO TABS: 1.0000 | ORAL_TABLET | Freq: Two times a day (BID) | ORAL | 0 refills | Status: DC

## 2023-06-20 MED ORDER — OSELTAMIVIR PHOSPHATE 75 MG PO CAPS: 75.0000 mg | ORAL_CAPSULE | Freq: Two times a day (BID) | ORAL | 0 refills | Status: DC

## 2023-06-20 MED ORDER — ALBUTEROL SULFATE (2.5 MG/3ML) 0.083% IN NEBU
2.5000 mg | INHALATION_SOLUTION | Freq: Once | RESPIRATORY_TRACT | Status: AC
  Administered 2023-06-20: 2.5 mg via RESPIRATORY_TRACT

## 2023-06-20 MED ORDER — ALBUTEROL SULFATE HFA 108 (90 BASE) MCG/ACT IN AERS: 2.0000 | INHALATION_SPRAY | RESPIRATORY_TRACT | 0 refills | Status: DC | PRN

## 2023-06-20 NOTE — ED Provider Notes (Signed)
 [facility]    CSN: [id number] Arrival date & time: 06/20/23  1127      History   Chief Complaint Chief Complaint  Patient presents with   Cough    Possible flu and I'm prone to pneumonia - Entered by patient    HPI Michaela Parker is a 63 y.o. female.  Patient with a history of anxiety, type 2 diabetes and a history of recurrent pneumonia x 2 presents today with with reports of fever, shortness of breath, mildly tachycardic, oxygen level 93%.  She has had a mild cough for about 2 days however overnight her cough has become severe with shortness of breath and caused fatigue and weakness.  She had fever today which she treated with Tylenol and ibuprofen.  She has had a close exposure to her daughter who has tested positive for influenza.   High Risk for complications related to influenza due to T2DM, age >37 , recurrent pneumonia.   Past Medical History:  Diagnosis Date   Anxiety    Diabetes mellitus without complication [facility])     Patient Active Problem List   Diagnosis Date Noted   Diabetes mellitus (HCC) 09/07/2016   History of hypertension 09/07/2016    Past Surgical History:  Procedure Laterality Date   APPENDECTOMY     BREAST SURGERY     CESAREAN SECTION      OB History   No obstetric history on file.      Home Medications    Prior to Admission medications   Medication Sig Start Date End Date Taking? Authorizing Provider  albuterol (VENTOLIN HFA) 108 (90 Base) MCG/ACT inhaler Inhale 2 puffs into the lungs every 4 (four) hours as needed for wheezing or shortness of breath. 06/20/23  Yes Bing Neighbors, NP  oseltamivir (TAMIFLU) 75 MG capsule Take 1 capsule (75 mg total) by mouth 2 (two) times daily. 06/20/23  Yes Bing Neighbors, NP  amoxicillin-clavulanate (AUGMENTIN) 875-125 MG tablet Take 1 tablet by mouth every 12 (twelve) hours. 06/20/23   Bing Neighbors, NP  Ascorbic Acid (VITAMIN C) 1000 MG tablet Take 1,000 mg by mouth daily.     [provider]  Aspirin 81 MG EC tablet Take 81 mg by mouth daily.    [provider]  aspirin EC 81 MG tablet Take 81 mg by mouth daily. 09/11/12   [provider]  B Complex-C (SUPER B COMPLEX PO) Take 1 tablet by mouth daily.    [provider]  benazepril (LOTENSIN) 10 MG tablet Take 10 mg by mouth daily.    [provider]  benazepril (LOTENSIN) 20 MG tablet Take 20 mg by mouth daily.    [provider]  chlorpheniramine-HYDROcodone (TUSSIONEX) 10-8 MG/5ML Take 5 mLs by mouth every 12 (twelve) hours as needed for cough. 09/07/16   [provider]  Cholecalciferol (VITAMIN D3 PO) Take by mouth.    [provider]  erythromycin ophthalmic ointment Place a 1/2 inch ribbon of ointment into the lower eyelid 4 times daily for 7 days. 02/25/23   Gustavus Bryant, FNP  fluconazole (DIFLUCAN) 150 MG tablet Take 150 mg by mouth once. 09/20/16   [provider]  guaiFENesin 200 MG tablet Take 1 tablet (200 mg total) by mouth every 4 (four) hours as needed for cough or to loosen phlegm. 05/31/21   Gustavus Bryant, FNP  HYDROcodone-acetaminophen (NORCO/VICODIN) 5-325 MG tablet Take 1 tablet by mouth 3 (three) times daily as needed for  moderate pain. 09/16/19   Hilts, Casimiro Needle, MD  insulin lispro protamine-lispro (HUMALOG MIX 75/25) (75-25) 100 UNIT/ML SUSP injection Inject 80 Units into the skin 2 (two) times daily with a meal. 09/11/12   [provider]  insulin NPH-regular Human (NOVOLIN 70/30) (70-30) 100 UNIT/ML injection Inject 0-85 Units into the skin 2 (two) times daily as needed (blood sugar reading). Last taken: 1045am. Glucose: 209    [provider]  levofloxacin (LEVAQUIN) 500 MG tablet Take 1 tablet (500 mg total) by mouth daily. 12/08/22   Tomi Bamberger, PA-C  linaGLIPtin-metFORMIN HCl (JENTADUETO) 2.08-998 MG TABS Take 1 tablet by mouth daily. 09/11/12   [provider]  Multiple Vitamin  (MULTIVITAMIN PO) Take 1 tablet by mouth daily. 09/11/12   [provider]  pravastatin (PRAVACHOL) 20 MG tablet Take 20 mg by mouth daily.    [provider]  traMADol (ULTRAM) 50 MG tablet Take 1 tablet (50 mg total) by mouth every 6 (six) hours as needed. 09/30/19   Hilts, Michael, MD  vitamin E 400 UNIT capsule Take 400 Units by mouth daily.    [provider]    Family History Family History  Problem Relation Age of Onset   COPD Mother    Cancer Father    Cancer Sister    Heart disease Brother     Social History Social History   Tobacco Use   Smoking status: Never   Smokeless tobacco: Never  Vaping Use   Vaping status: Never Used  Substance Use Topics   Alcohol use: Yes    Comment: Occassionally.   Drug use: Never     Allergies   Patient has no known allergies.   Review of Systems Review of Systems  Respiratory:  Positive for cough.      Physical Exam Triage Vital Signs ED Triage Vitals  Encounter Vitals Group     BP 06/20/23 1156 (!) 145/69     Systolic BP Percentile --      Diastolic BP Percentile --      Pulse Rate 06/20/23 1156 (!) 112     Resp 06/20/23 1156 18     Temp 06/20/23 1156 98.7 F (37.1 C)     Temp Source 06/20/23 1156 Oral     SpO2 06/20/23 1156 93 %     Weight --      Height --      Head Circumference --      Peak Flow --      Pain Score 06/20/23 1153 0     Pain Loc --      Pain Education --      Exclude from Growth Chart --    No data found.  Updated Vital Signs BP (!) 145/69 (BP Location: Right Arm)   Pulse (!) 112   Temp 98.7 F (37.1 C) (Oral)   Resp 18   LMP  (LMP Unknown)   SpO2 93%   Visual Acuity Right Eye Distance:   Left Eye Distance:   Bilateral Distance:    Right Eye Near:   Left Eye Near:    Bilateral Near:     Physical Exam Constitutional:      Appearance: She is ill-appearing.  HENT:     Head: Normocephalic and atraumatic.     Right Ear: External ear normal.     Left  Ear: External ear normal.     Nose: Congestion present. No rhinorrhea.  Eyes:     Extraocular Movements: Extraocular movements  intact.     Conjunctiva/sclera: Conjunctivae normal.     Pupils: Pupils are equal, round, and reactive to light.  Cardiovascular:     Rate and Rhythm: Regular rhythm. Tachycardia present.  Pulmonary:     Breath sounds: Wheezing, rhonchi and rales present.  Musculoskeletal:     Cervical back: Normal range of motion.  Lymphadenopathy:     Cervical: Cervical adenopathy present.  Skin:    General: Skin is warm.  Neurological:     General: No focal deficit present.     Mental Status: She is alert.     UC Treatments / Results  Labs (all labs ordered are listed, but only abnormal results are displayed) Labs Reviewed  POC COVID19/FLU A&B COMBO - Abnormal; Notable for the following components:      Result Value   Influenza A Antigen, POC Positive (*)    All other components within normal limits  POCT FASTING CBG [facility] MANUAL ENTRY - Abnormal; Notable for the following components:   POCT Glucose ([facility]) 376 (*)    All other components within normal limits  POCT URINALYSIS DIP (MANUAL ENTRY) - Abnormal; Notable for the following components:   Glucose, UA =500 (*)    Spec Grav, UA <=1.005 (*)    Blood, UA trace-intact (*)    Leukocytes, UA Trace (*)    All other components within normal limits  URINE CULTURE    EKG   Radiology DG Chest 2 View Result Date: 06/20/2023 CLINICAL DATA:  Shortness of breath, cough. EXAM: CHEST - 2 VIEW COMPARISON:  May 14, 2015. FINDINGS: The heart size and mediastinal contours are within normal limits. Both lungs are clear. The visualized skeletal structures are unremarkable. IMPRESSION: No active cardiopulmonary disease. Electronically Signed   By: Lupita Raider M.D.   On: 06/20/2023 14:25    Procedures Procedures (including critical care time)  Medications Ordered in UC Medications  albuterol (PROVENTIL) (2.5 MG/3ML)  0.083% nebulizer solution 2.5 mg (2.5 mg Nebulization Given 06/20/23 1240)    Initial Impression / Assessment and Plan / UC Course  I have reviewed the triage vital signs and the nursing notes.  Pertinent labs & imaging results that were available during my care of the patient were reviewed by me and considered in my medical decision making (see chart for details).    1. Influenza A (Primary), DuoNeb here in clinic as patient was actively wheezing but shortness of breath on arrival.  - POC Covid19/Flu A&B Antigen, -confirmed Influenza A  - albuterol (PROVENTIL) (2.5 MG/3ML) 0.083% nebulizer solution 2.5 mg - DG Chest 2 View, negative   - albuterol (VENTOLIN HFA) 108 (90 Base) MCG/ACT inhaler; Inhale 2 puffs into the lungs every 4 (four) hours as needed for wheezing or shortness of breath.  Dispense: 8 g; Refill: 0  2. Shortness of breath some concern for possible pneumonia.  Initial read of x-ray negative when interpreted by this provider.  Radiology read confirmed and negative read after patient was discharged. Given history of recurrent pneumonia treating more prophylactically with Augmentin in combination with concurrent treatment for UTI patient was diagnosed here with today.  Patient was given a DuoNeb treatment here in clinic which improved work of breathing significantly prior to discharge. - DG Chest 2 View  - albuterol (VENTOLIN HFA) 108 (90 Base) MCG/ACT inhaler; Inhale 2 puffs into the lungs every 4 (four) hours as needed for wheezing or shortness of breath.  Dispense: 8 g; Refill: 0  - amoxicillin-clavulanate (AUGMENTIN) 875-125 MG  tablet; Take 1 tablet by mouth every 12 (twelve) hours.  Dispense: 20 tablet; Refill: 0  3. Acute UTI - POCT urinalysis dipstick - Urine Culture - amoxicillin-clavulanate (AUGMENTIN) 875-125 MG tablet; Take 1 tablet by mouth every 12 (twelve) hours.  Dispense: 20 tablet; Refill: 0  4. Type 2 diabetes mellitus with hyperglycemia, with long-term  current use of insulin (HCC), patient has not taken insulin. Take insulin and closely monitor, for symptoms of hyperglycemia - POC Covid19/Flu A&B Antigen - POCT CBG (manual entry), glucose elevated 376    Final Clinical Impressions(s) / UC Diagnoses   Final diagnoses:  Influenza A  Shortness of breath  Acute UTI  Type 2 diabetes mellitus with hyperglycemia, with long-term current use of insulin (HCC)     Discharge Instructions      -Your blood sugar is elevated this is likely the reason you are experiencing increased urinary frequency resume administration of your insulin.  If your home machine readings have go immediately to the emergency department this could indicate that your in diabetic ketoacidosis which is a medical emergency. Coverage of a lower respiratory infection and for treatment of most UTIs start Augmentin twice daily for 10 days.  Given your elevated blood sugar I would like to avoid prescribing prednisone as this will only increase your blood sugar higher so I have prescribed albuterol inhaler to use every 4-6 hours as needed for shortness of breath or chest tightness or wheezing. Continue over-the-counter medication to help with cough. Influenza test is positive.  You are considered contagious to others as long as you have a measurable fever with a temperature 100 F.  You should consider yourself infectious until you are fever free for 24 hours without fever lowering medications. Continue to alternate Tylenol and ibuprofen for management of fever.   Force fluids to maintain hydration. Tamiflu twice daily for the next 5 days to reduce symptoms and course of influenza virus.   If you develop any shortness of breath, wheezing or difficulty breathing go immediately to the nearest emergency department.        ED Prescriptions     Medication Sig Dispense Auth. Provider   albuterol (VENTOLIN HFA) 108 (90 Base) MCG/ACT inhaler Inhale 2 puffs into the lungs every 4 (four)  hours as needed for wheezing or shortness of breath. 8 g Bing Neighbors, NP   amoxicillin-clavulanate (AUGMENTIN) 875-125 MG tablet  (Status: Discontinued) Take 1 tablet by mouth every 12 (twelve) hours. 20 tablet Bing Neighbors, NP   amoxicillin-clavulanate (AUGMENTIN) 875-125 MG tablet Take 1 tablet by mouth every 12 (twelve) hours. 20 tablet Bing Neighbors, NP   oseltamivir (TAMIFLU) 75 MG capsule Take 1 capsule (75 mg total) by mouth 2 (two) times daily. 10 capsule Bing Neighbors, NP      PDMP not reviewed this encounter.   Bing Neighbors, NP 06/20/23 1500

## 2023-06-20 NOTE — Discharge Instructions (Addendum)
-  Your blood sugar is elevated this is likely the reason you are experiencing increased urinary frequency resume administration of your insulin.  If your home machine readings have go immediately to the emergency department this could indicate that your in diabetic ketoacidosis which is a medical emergency. Coverage of a lower respiratory infection and for treatment of most UTIs start Augmentin twice daily for 10 days.  Given your elevated blood sugar I would like to avoid prescribing prednisone as this will only increase your blood sugar higher so I have prescribed albuterol inhaler to use every 4-6 hours as needed for shortness of breath or chest tightness or wheezing. Continue over-the-counter medication to help with cough. Influenza test is positive.  You are considered contagious to others as long as you have a measurable fever with a temperature 100 F.  You should consider yourself infectious until you are fever free for 24 hours without fever lowering medications. Continue to alternate Tylenol and ibuprofen for management of fever.   Force fluids to maintain hydration. Tamiflu twice daily for the next 5 days to reduce symptoms and course of influenza virus.   If you develop any shortness of breath, wheezing or difficulty breathing go immediately to the nearest emergency department.

## 2023-06-20 NOTE — ED Triage Notes (Addendum)
 Possible flu and I'm prone to pneumonia - Entered by patient  Pt states symptoms onset Sunday. The symptoms worsened last night. Cough is tighter which takes more effort to cough.   Previous fever 101 - taken at home. Took dual action Advil/Tylenol combo around 8:15.

## 2023-06-21 LAB — URINE CULTURE
Culture: <10000 — AB
Special Requests: NORMAL

## 2023-08-21 ENCOUNTER — Ambulatory Visit: Payer: 59 | Admitting: Family Medicine

## 2023-09-06 ENCOUNTER — Ambulatory Visit: Admission: EM | Admit: 2023-09-06 | Discharge: 2023-09-06 | Disposition: A | Discharge status: Home / Self Care

## 2023-09-06 DIAGNOSIS — R319 Hematuria, unspecified: Secondary | ICD-10-CM | POA: Diagnosis not present

## 2023-09-06 DIAGNOSIS — R1032 Left lower quadrant pain: Secondary | ICD-10-CM

## 2023-09-06 LAB — POCT URINALYSIS DIP (MANUAL ENTRY)
Bilirubin, UA: NEGATIVE
Glucose, UA: 500 mg/dL — AB
Ketones, POC UA: NEGATIVE mg/dL
Leukocytes, UA: NEGATIVE
Nitrite, UA: NEGATIVE
Protein Ur, POC: NEGATIVE mg/dL
Spec Grav, UA: 1.015 (ref 1.010–1.025)
Urobilinogen, UA: 0.2 U/dL
pH, UA: 6 (ref 5.0–8.0)

## 2023-09-06 LAB — POCT FASTING CBG KUC MANUAL ENTRY: POCT Glucose (KUC): 279 mg/dL — AB (ref 70–99)

## 2023-09-06 NOTE — ED Triage Notes (Signed)
"  I started with back pain (lower right) and the this pain was recurrent this morning (left lower quadrant/abdomen), I feel like I have a UTI, I have had them in the past, also Nausea this morning". No fever.

## 2023-09-06 NOTE — ED Provider Notes (Signed)
 [facility]  Note:  This document was prepared using Conservation officer, historic buildings and may include unintentional dictation errors.  MRN: [id number] DOB: 07-09-1960  Subjective:   Michaela Parker is a 63 y.o. female presenting for left lower back/flank pain since this morning with left lower quadrant abdominal pain.  Patient reports that she has past history of UTI.  Patient also reports mild nausea this morning however not having any fever or increased urinary frequency or dysuria.  Patient reports that she has been taking ibuprofen which seems to help quite a bit with pain but reports that pain is starting to come back she will take more ibuprofen when she gets home.  Patient just went to make sure there was any urinary tract infection.  No current facility-administered medications for this encounter.  Current Outpatient Medications:    benazepril (LOTENSIN) 10 MG tablet, Take 10 mg by mouth daily., Disp: , Rfl:    insulin NPH-regular Human (NOVOLIN 70/30) (70-30) 100 UNIT/ML injection, Inject 0-85 Units into the skin 2 (two) times daily as needed (blood sugar reading). Last taken: 1045am. Glucose: 209, Disp: , Rfl:    nitrofurantoin, macrocrystal-monohydrate, (MACROBID) 100 MG capsule, Take 100 mg by mouth every 12 (twelve) hours., Disp: , Rfl:    albuterol  (VENTOLIN  HFA) 108 (90 Base) MCG/ACT inhaler, Inhale 2 puffs into the lungs every 4 (four) hours as needed for wheezing or shortness of breath., Disp: 8 g, Rfl: 0   amoxicillin -clavulanate (AUGMENTIN ) 875-125 MG tablet, Take 1 tablet by mouth every 12 (twelve) hours., Disp: 20 tablet, Rfl: 0   Ascorbic Acid (VITAMIN C) 1000 MG tablet, Take 1,000 mg by mouth daily., Disp: , Rfl:    Aspirin  81 MG EC tablet, Take 81 mg by mouth daily., Disp: , Rfl:    aspirin  EC 81 MG tablet, Take 81 mg by mouth daily., Disp: , Rfl:    B Complex-C (SUPER B COMPLEX PO), Take 1 tablet by mouth daily., Disp: , Rfl:    benazepril (LOTENSIN) 20 MG  tablet, Take 20 mg by mouth daily., Disp: , Rfl:    chlorpheniramine-HYDROcodone  (TUSSIONEX) 10-8 MG/5ML, Take 5 mLs by mouth every 12 (twelve) hours as needed for cough., Disp: , Rfl:    Cholecalciferol (VITAMIN D3 PO), Take by mouth., Disp: , Rfl:    erythromycin  ophthalmic ointment, Place a 1/2 inch ribbon of ointment into the lower eyelid 4 times daily for 7 days., Disp: 3.5 g, Rfl: 0   fluconazole (DIFLUCAN) 150 MG tablet, Take 150 mg by mouth once., Disp: , Rfl:    guaiFENesin  200 MG tablet, Take 1 tablet (200 mg total) by mouth every 4 (four) hours as needed for cough or to loosen phlegm., Disp: 30 suppository, Rfl: 0   HYDROcodone -acetaminophen  (NORCO/VICODIN) 5-325 MG tablet, Take 1 tablet by mouth 3 (three) times daily as needed for moderate pain., Disp: 30 tablet, Rfl: 0   insulin lispro protamine-lispro (HUMALOG MIX 75/25) (75-25) 100 UNIT/ML SUSP injection, Inject 80 Units into the skin 2 (two) times daily with a meal., Disp: , Rfl:    levofloxacin  (LEVAQUIN ) 500 MG tablet, Take 1 tablet (500 mg total) by mouth daily., Disp: 7 tablet, Rfl: 0   linaGLIPtin-metFORMIN HCl (JENTADUETO) 2.08-998 MG TABS, Take 1 tablet by mouth daily., Disp: , Rfl:    Multiple Vitamin (MULTIVITAMIN PO), Take 1 tablet by mouth daily., Disp: , Rfl:    oseltamivir  (TAMIFLU ) 75 MG capsule, Take 1 capsule (75 mg total) by mouth 2 (two) times  daily., Disp: 10 capsule, Rfl: 0   pravastatin (PRAVACHOL) 20 MG tablet, Take 20 mg by mouth daily., Disp: , Rfl:    traMADol  (ULTRAM ) 50 MG tablet, Take 1 tablet (50 mg total) by mouth every 6 (six) hours as needed., Disp: 30 tablet, Rfl: 0   vitamin E 400 UNIT capsule, Take 400 Units by mouth daily., Disp: , Rfl:    Allergies  Allergen Reactions   Other Rash    Elastic in socks    Past Medical History:  Diagnosis Date   Anxiety    Diabetes mellitus without complication (HCC)      Past Surgical History:  Procedure Laterality Date   APPENDECTOMY     BREAST  SURGERY     CESAREAN SECTION      Family History  Problem Relation Age of Onset   COPD Mother    Cancer Father    Cancer Sister    Heart disease Brother     Social History   Tobacco Use   Smoking status: Never   Smokeless tobacco: Never  Vaping Use   Vaping status: Never Used  Substance Use Topics   Alcohol use: Yes    Comment: Occassionally.   Drug use: Never    ROS Refer to HPI for ROS details.  Objective:   Vitals: BP (!) 159/82 (BP Location: Left Arm)   Pulse (!) 111   Temp 100.2 F (37.9 C) (Oral)   Resp 18   Ht 5' 2.5" (1.588 m)   Wt 220 lb (99.8 kg)   SpO2 96%   BMI 39.60 kg/m   Physical Exam Vitals and nursing note reviewed.  Constitutional:      General: She is not in acute distress.    Appearance: Normal appearance. She is well-developed. She is not ill-appearing or toxic-appearing.  HENT:     Head: Normocephalic and atraumatic.  Cardiovascular:     Rate and Rhythm: Normal rate.  Pulmonary:     Effort: Pulmonary effort is normal. No respiratory distress.  Abdominal:     General: There is no distension.     Palpations: Abdomen is soft.     Tenderness: There is no abdominal tenderness. There is left CVA tenderness. There is no right CVA tenderness, guarding or rebound.  Skin:    General: Skin is warm and dry.  Neurological:     General: No focal deficit present.     Mental Status: She is alert and oriented to person, place, and time.  Psychiatric:        Mood and Affect: Mood normal.        Behavior: Behavior normal.     Procedures  Results for orders placed or performed during the hospital encounter of 09/06/23 (from the past 24 hours)  POCT urinalysis dipstick     Status: Abnormal   Collection Time: 09/06/23  3:17 PM  Result Value Ref Range   Color, UA light yellow (A) yellow   Clarity, UA cloudy (A) clear   Glucose, UA =500 (A) negative mg/dL   Bilirubin, UA negative negative   Ketones, POC UA negative negative mg/dL   Spec  Grav, UA 0.981 1.010 - 1.025   Blood, UA trace-lysed (A) negative   pH, UA 6.0 5.0 - 8.0   Protein Ur, POC negative negative mg/dL   Urobilinogen, UA 0.2 0.2 or 1.0 E.U./dL   Nitrite, UA Negative Negative   Leukocytes, UA Negative Negative  POCT CBG (manual entry)     Status: Abnormal  Collection Time: 09/06/23  4:38 PM  Result Value Ref Range   POCT Glucose ([facility]) 279 (A) 70 - 99 mg/dL    No results found.   Assessment and Plan :     Discharge Instructions       1. Left lower quadrant abdominal pain (Primary) - As discussed possibility of left renal lithiasis as cause of radiating pain from left flank to left lower quadrant abdomen.  Use strainer and drink plenty of fluids if symptoms become much more severe follow-up in emergency department for further evaluation management.  2. Hematuria, unspecified type - POCT urinalysis dipstick completed in UC shows 500 glucose, blood, no nitrite, no leukocytes, no sign of urinary tract infection. - POCT CBG (manual entry) completed in UC, glucose 279 mg/dL - Urine Culture collected in UC and sent to lab for further testing results should be available in 2 to 3 days.     Rudy Domek B Yoland Scherr   Johnn Krasowski, [place], [place] 09/06/23 1708

## 2023-09-06 NOTE — Discharge Instructions (Addendum)
  1. Left lower quadrant abdominal pain (Primary) - As discussed possibility of left renal lithiasis as cause of radiating pain from left flank to left lower quadrant abdomen.  Use strainer and drink plenty of fluids if symptoms become much more severe follow-up in emergency department for further evaluation management.  2. Hematuria, unspecified type - POCT urinalysis dipstick completed in UC shows 500 glucose, blood, no nitrite, no leukocytes, no sign of urinary tract infection. - POCT CBG (manual entry) completed in UC, glucose 279 mg/dL - Urine Culture collected in UC and sent to lab for further testing results should be available in 2 to 3 days.

## 2023-09-06 NOTE — ED Notes (Signed)
 Strainer provider for urine to be collected/looked at (with urination at home) for possible kidney stones.

## 2023-09-07 ENCOUNTER — Ambulatory Visit (HOSPITAL_COMMUNITY): Payer: Self-pay

## 2023-09-07 ENCOUNTER — Ambulatory Visit

## 2023-09-07 LAB — URINE CULTURE: Special Requests: NORMAL

## 2023-09-10 ENCOUNTER — Encounter: Payer: Self-pay | Admitting: Family Medicine

## 2023-09-10 ENCOUNTER — Ambulatory Visit: Admitting: Family Medicine

## 2023-09-10 VITALS — BP 138/76 | HR 77 | Temp 98.8°F | Ht 62.5 in | Wt 222.0 lb

## 2023-09-10 DIAGNOSIS — E1165 Type 2 diabetes mellitus with hyperglycemia: Secondary | ICD-10-CM | POA: Insufficient documentation

## 2023-09-10 DIAGNOSIS — R0683 Snoring: Secondary | ICD-10-CM | POA: Diagnosis not present

## 2023-09-10 DIAGNOSIS — R300 Dysuria: Secondary | ICD-10-CM | POA: Diagnosis not present

## 2023-09-10 DIAGNOSIS — Z6839 Body mass index (BMI) 39.0-39.9, adult: Secondary | ICD-10-CM

## 2023-09-10 DIAGNOSIS — Z136 Encounter for screening for cardiovascular disorders: Secondary | ICD-10-CM | POA: Diagnosis not present

## 2023-09-10 DIAGNOSIS — E782 Mixed hyperlipidemia: Secondary | ICD-10-CM

## 2023-09-10 DIAGNOSIS — Z794 Long term (current) use of insulin: Secondary | ICD-10-CM | POA: Insufficient documentation

## 2023-09-10 DIAGNOSIS — E66812 Obesity, class 2: Secondary | ICD-10-CM

## 2023-09-10 DIAGNOSIS — I10 Essential (primary) hypertension: Secondary | ICD-10-CM | POA: Diagnosis not present

## 2023-09-10 LAB — POCT URINALYSIS DIPSTICK
Bilirubin, UA: NEGATIVE
Blood, UA: 25
Glucose, UA: NEGATIVE
Ketones, UA: NEGATIVE
Leukocytes, UA: NEGATIVE
Nitrite, UA: NEGATIVE
Protein, UA: NEGATIVE
Spec Grav, UA: 1.02 (ref 1.010–1.025)
Urobilinogen, UA: 0.2 U/dL
pH, UA: 6 (ref 5.0–8.0)

## 2023-09-10 NOTE — Progress Notes (Addendum)
 New Patient Office Visit  Subjective    Patient ID: Michaela Parker, female    DOB: 1960/07/11  Age: 63 y.o. MRN: [id number]  CC:  Chief Complaint  Patient presents with   Establish Care    HPI [name] presents to establish care today. She is not fasting today, will come back for labs. Has hx T2DM, GAD, obesity, HTN, HLD. Recent concern for UTI with R flank pain, fever up to 101.3. Was seen at [facility] and told she likely had a kidney stone.  States that fever and pain has since resolved.  She is concerned that her diabetes is not well controlled given blood sugars at home spanning from 70-400. Has tried metformin, jentadueto, is now taking 70/30 insulin 84 units daily.  Negative personal and family history of medullary thyroid cancer, TEN.  Reports snoring and increased daytime fatigue. Concern for sleep apnea.  Would also like to address weight and anxiety. Reports compliance with medication regimen.  Denies other concerns today.  Outpatient Encounter Medications as of 09/10/2023  Medication Sig   Ascorbic Acid (VITAMIN C) 1000 MG tablet Take 1,000 mg by mouth daily.   Aspirin  81 MG EC tablet Take 81 mg by mouth daily.   B Complex-C (SUPER B COMPLEX PO) Take 1 tablet by mouth daily.   benazepril (LOTENSIN) 10 MG tablet Take 10 mg by mouth daily.   Cholecalciferol (VITAMIN D3 PO) Take by mouth.   fluconazole (DIFLUCAN) 150 MG tablet Take 150 mg by mouth once.   insulin NPH-regular Human (NOVOLIN 70/30) (70-30) 100 UNIT/ML injection Inject 0-85 Units into the skin 2 (two) times daily as needed (blood sugar reading). Last taken: 1045am. Glucose: 209   Multiple Vitamin (MULTIVITAMIN PO) Take 1 tablet by mouth daily.   vitamin E 400 UNIT capsule Take 400 Units by mouth daily.   [DISCONTINUED] albuterol  (VENTOLIN  HFA) 108 (90 Base) MCG/ACT inhaler Inhale 2 puffs into the lungs every 4 (four) hours as needed for wheezing or shortness of breath. (Patient not taking: Reported on 09/10/2023)    [DISCONTINUED] amoxicillin -clavulanate (AUGMENTIN ) 875-125 MG tablet Take 1 tablet by mouth every 12 (twelve) hours. (Patient not taking: Reported on 09/10/2023)   [DISCONTINUED] aspirin  EC 81 MG tablet Take 81 mg by mouth daily. (Patient not taking: Reported on 09/10/2023)   [DISCONTINUED] benazepril (LOTENSIN) 20 MG tablet Take 20 mg by mouth daily. (Patient not taking: Reported on 09/10/2023)   [DISCONTINUED] chlorpheniramine-HYDROcodone  (TUSSIONEX) 10-8 MG/5ML Take 5 mLs by mouth every 12 (twelve) hours as needed for cough. (Patient not taking: Reported on 09/10/2023)   [DISCONTINUED] erythromycin  ophthalmic ointment Place a 1/2 inch ribbon of ointment into the lower eyelid 4 times daily for 7 days. (Patient not taking: Reported on 09/10/2023)   [DISCONTINUED] guaiFENesin  200 MG tablet Take 1 tablet (200 mg total) by mouth every 4 (four) hours as needed for cough or to loosen phlegm. (Patient not taking: Reported on 09/10/2023)   [DISCONTINUED] HYDROcodone -acetaminophen  (NORCO/VICODIN) 5-325 MG tablet Take 1 tablet by mouth 3 (three) times daily as needed for moderate pain. (Patient not taking: Reported on 09/10/2023)   [DISCONTINUED] insulin lispro protamine-lispro (HUMALOG MIX 75/25) (75-25) 100 UNIT/ML SUSP injection Inject 80 Units into the skin 2 (two) times daily with a meal. (Patient not taking: Reported on 09/10/2023)   [DISCONTINUED] levofloxacin  (LEVAQUIN ) 500 MG tablet Take 1 tablet (500 mg total) by mouth daily. (Patient not taking: Reported on 09/10/2023)   [DISCONTINUED] linaGLIPtin-metFORMIN HCl (JENTADUETO) 2.08-998 MG TABS Take 1 tablet  by mouth daily. (Patient not taking: Reported on 09/10/2023)   [DISCONTINUED] nitrofurantoin, macrocrystal-monohydrate, (MACROBID) 100 MG capsule Take 100 mg by mouth every 12 (twelve) hours. (Patient not taking: Reported on 09/10/2023)   [DISCONTINUED] oseltamivir  (TAMIFLU ) 75 MG capsule Take 1 capsule (75 mg total) by mouth 2 (two) times daily. (Patient not  taking: Reported on 09/10/2023)   [DISCONTINUED] pravastatin (PRAVACHOL) 20 MG tablet Take 20 mg by mouth daily. (Patient not taking: Reported on 09/10/2023)   [DISCONTINUED] traMADol  (ULTRAM ) 50 MG tablet Take 1 tablet (50 mg total) by mouth every 6 (six) hours as needed. (Patient not taking: Reported on 09/10/2023)   No facility-administered encounter medications on file as of 09/10/2023.    Past Medical History:  Diagnosis Date   Anxiety    Cataract 2023   Still have 1   Diabetes mellitus without complication (HCC)    Hypertension    Not dure because i was put on benazepril    Past Surgical History:  Procedure Laterality Date   APPENDECTOMY     BREAST SURGERY     CESAREAN SECTION      Family History  Problem Relation Age of Onset   COPD Mother    Stroke Mother    Cancer Father    Alcohol abuse Father    Cancer Sister    Heart disease Sister    Heart disease Brother    Asthma Daughter    Arthritis Maternal Grandmother    Heart disease Brother    Heart disease Sister     Social History   Socioeconomic History   Marital status: Married    Spouse name: Not on file   Number of children: Not on file   Years of education: Not on file   Highest education level: Not on file  Occupational History   Not on file  Tobacco Use   Smoking status: Never   Smokeless tobacco: Never  Vaping Use   Vaping status: Never Used  Substance and Sexual Activity   Alcohol use: Yes    Comment: Maybe once per month   Drug use: Never   Sexual activity: Not Currently  Other Topics Concern   Not on file  Social History Narrative   Not on file   Social Drivers of Health   Financial Resource Strain: Not on file  Food Insecurity: Not on file  Transportation Needs: Not on file  Physical Activity: Not on file  Stress: Not on file  Social Connections: Not on file  Intimate Partner Violence: Not on file    ROS Per HPI      Objective    BP 138/76 (BP Location: Left Arm, Patient  Position: Sitting)   Pulse 77   Temp 98.8 F (37.1 C) (Temporal)   Ht 5' 2.5" (1.588 m)   Wt 222 lb (100.7 kg)   SpO2 96%   BMI 39.96 kg/m   Physical Exam Vitals and nursing note reviewed.  Constitutional:      General: She is not in acute distress.    Appearance: Normal appearance. She is obese.  HENT:     Head: Normocephalic and atraumatic.     Right Ear: External ear normal.     Left Ear: External ear normal.     Nose: Nose normal.     Mouth/Throat:     Mouth: Mucous membranes are moist.     Pharynx: Oropharynx is clear.  Eyes:     Extraocular Movements: Extraocular movements intact.     Pupils:  Pupils are equal, round, and reactive to light.  Cardiovascular:     Rate and Rhythm: Normal rate and regular rhythm.     Pulses: Normal pulses.     Heart sounds: Normal heart sounds.  Pulmonary:     Effort: Pulmonary effort is normal. No respiratory distress.     Breath sounds: Normal breath sounds. No wheezing, rhonchi or rales.  Musculoskeletal:        General: Normal range of motion.     Cervical back: Normal range of motion.     Right lower leg: No edema.     Left lower leg: No edema.  Lymphadenopathy:     Cervical: No cervical adenopathy.  Neurological:     General: No focal deficit present.     Mental Status: She is alert and oriented to person, place, and time.  Psychiatric:        Mood and Affect: Mood normal.        Thought Content: Thought content normal.         Assessment & Plan:   Type 2 diabetes mellitus with hyperglycemia, with long-term current use of insulin (HCC) -     Hemoglobin A1c -     Comprehensive metabolic panel with GFR; Future -     Hemoglobin A1c; Future -     Microalbumin / creatinine urine ratio; Future -     HM Diabetes Foot Exam  Primary hypertension -     CBC with Differential/Platelet; Future -     Comprehensive metabolic panel with GFR; Future -     Ambulatory referral to Neurology  Encounter for screening for  cardiovascular disorders -     Lipid panel  Mixed hyperlipidemia -     Lipid panel; Future  Dysuria -     POCT urinalysis dipstick -     Urine Culture; Future  Snoring -     Ambulatory referral to Neurology  Class 2 severe obesity due to excess calories with serious comorbidity and body mass index (BMI) of 39.0 to 39.9 in adult [facility])     Return in about 2 weeks (around 09/24/2023) for follow up with medications, labs.   Wellington Half, FNP

## 2023-09-10 NOTE — Patient Instructions (Addendum)
 Welcome to [facility]!  Thank you for choosing us  for your Primary Care needs.   We offer in person and video appointments for your convenience. You may call our office to schedule appointments, or you may schedule appointments with me through MyChart.   The best way to get in contact with me is via MyChart message. This will get to me faster than a phone call, unless there is an emergency, then please call 911.  The lab is located downstairs in the Sports Medicine building, we also have xray available there.   We are going to check your urine today.  We are going to send your urine for culture today as well. We will be in contact with results that that require further attention.   I have ordered labs for you to come in and have drawn (8am-4:30).  Please follow up with me in about 2 weeks to discuss labs and medications.  [employer] on social media.

## 2023-09-11 DIAGNOSIS — R300 Dysuria: Secondary | ICD-10-CM | POA: Diagnosis not present

## 2023-09-11 LAB — MICROALBUMIN / CREATININE URINE RATIO
Creatinine,U: 57.3 mg/dL
Microalb Creat Ratio: 35.8 mg/g — ABNORMAL HIGH (ref 0.0–30.0)
Microalb, Ur: 2.1 mg/dL — ABNORMAL HIGH (ref 0.0–1.9)

## 2023-09-12 ENCOUNTER — Other Ambulatory Visit (INDEPENDENT_AMBULATORY_CARE_PROVIDER_SITE_OTHER)

## 2023-09-12 DIAGNOSIS — E782 Mixed hyperlipidemia: Secondary | ICD-10-CM

## 2023-09-12 DIAGNOSIS — I10 Essential (primary) hypertension: Secondary | ICD-10-CM | POA: Diagnosis not present

## 2023-09-12 DIAGNOSIS — Z794 Long term (current) use of insulin: Secondary | ICD-10-CM

## 2023-09-12 DIAGNOSIS — E1165 Type 2 diabetes mellitus with hyperglycemia: Secondary | ICD-10-CM

## 2023-09-12 LAB — CBC WITH DIFFERENTIAL/PLATELET
Basophils Absolute: 0 10*3/uL (ref 0.0–0.1)
Basophils Relative: 0.5 % (ref 0.0–3.0)
Eosinophils Absolute: 0.2 10*3/uL (ref 0.0–0.7)
Eosinophils Relative: 2.3 % (ref 0.0–5.0)
HCT: 40.4 % (ref 36.0–46.0)
Hemoglobin: 13.7 g/dL (ref 12.0–15.0)
Lymphocytes Relative: 40.3 % (ref 12.0–46.0)
Lymphs Abs: 3.6 10*3/uL (ref 0.7–4.0)
MCHC: 34 g/dL (ref 30.0–36.0)
MCV: 89 fl (ref 78.0–100.0)
Monocytes Absolute: 0.6 10*3/uL (ref 0.1–1.0)
Monocytes Relative: 7 % (ref 3.0–12.0)
Neutro Abs: 4.4 10*3/uL (ref 1.4–7.7)
Neutrophils Relative %: 49.9 % (ref 43.0–77.0)
Platelets: 275 10*3/uL (ref 150.0–400.0)
RBC: 4.53 Mil/uL (ref 3.87–5.11)
RDW: 13.1 % (ref 11.5–15.5)
WBC: 8.9 10*3/uL (ref 4.0–10.5)

## 2023-09-12 LAB — COMPREHENSIVE METABOLIC PANEL WITH GFR
ALT: 13 U/L (ref 0–35)
AST: 13 U/L (ref 0–37)
Albumin: 4.2 g/dL (ref 3.5–5.2)
Alkaline Phosphatase: 52 U/L (ref 39–117)
BUN: 12 mg/dL (ref 6–23)
CO2: 32 meq/L (ref 19–32)
Calcium: 9.3 mg/dL (ref 8.4–10.5)
Chloride: 100 meq/L (ref 96–112)
Creatinine, Ser: 0.74 mg/dL (ref 0.40–1.20)
GFR: 86.18 mL/min (ref >60.00)
Glucose, Bld: 200 mg/dL — ABNORMAL HIGH (ref 70–99)
Potassium: 4.3 meq/L (ref 3.5–5.1)
Sodium: 138 meq/L (ref 135–145)
Total Bilirubin: 0.4 mg/dL (ref 0.2–1.2)
Total Protein: 6.7 g/dL (ref 6.0–8.3)

## 2023-09-12 LAB — URINE CULTURE: Result:: NO GROWTH

## 2023-09-12 LAB — LIPID PANEL
Cholesterol: 149 mg/dL (ref 0–200)
HDL: 26.7 mg/dL — ABNORMAL LOW (ref >39.00)
LDL Cholesterol: 104 mg/dL — ABNORMAL HIGH (ref 0–99)
NonHDL: 121.81
Total CHOL/HDL Ratio: 6
Triglycerides: 89 mg/dL (ref 0.0–149.0)
VLDL: 17.8 mg/dL (ref 0.0–40.0)

## 2023-09-12 LAB — HEMOGLOBIN A1C: Hgb A1c MFr Bld: 10 % — ABNORMAL HIGH (ref 4.6–6.5)

## 2023-09-13 ENCOUNTER — Ambulatory Visit: Admitting: Family Medicine

## 2023-09-16 ENCOUNTER — Ambulatory Visit: Payer: Self-pay | Admitting: Family Medicine

## 2023-09-16 DIAGNOSIS — E782 Mixed hyperlipidemia: Secondary | ICD-10-CM

## 2023-09-16 DIAGNOSIS — E1165 Type 2 diabetes mellitus with hyperglycemia: Secondary | ICD-10-CM

## 2023-09-16 MED ORDER — TIRZEPATIDE 2.5 MG/0.5ML ~~LOC~~ SOAJ
2.5000 mg | SUBCUTANEOUS | 0 refills | Status: DC
Start: 2023-09-16 — End: 2023-09-28

## 2023-09-16 MED ORDER — ROSUVASTATIN CALCIUM 10 MG PO TABS
10.0000 mg | ORAL_TABLET | Freq: Every day | ORAL | 1 refills | Status: DC
Start: 2023-09-16 — End: 2023-09-28

## 2023-09-21 ENCOUNTER — Other Ambulatory Visit (HOSPITAL_COMMUNITY): Payer: Self-pay

## 2023-09-27 DIAGNOSIS — R300 Dysuria: Secondary | ICD-10-CM | POA: Insufficient documentation

## 2023-09-27 DIAGNOSIS — E66812 Obesity, class 2: Secondary | ICD-10-CM | POA: Insufficient documentation

## 2023-09-27 DIAGNOSIS — Z136 Encounter for screening for cardiovascular disorders: Secondary | ICD-10-CM | POA: Insufficient documentation

## 2023-09-27 DIAGNOSIS — R0683 Snoring: Secondary | ICD-10-CM | POA: Insufficient documentation

## 2023-09-27 DIAGNOSIS — Z79899 Other long term (current) drug therapy: Secondary | ICD-10-CM | POA: Insufficient documentation

## 2023-09-27 DIAGNOSIS — E782 Mixed hyperlipidemia: Secondary | ICD-10-CM | POA: Insufficient documentation

## 2023-09-27 NOTE — Progress Notes (Unsigned)
   Established Patient Office Visit  Subjective   Patient ID: CAMI DELAWDER, female    DOB: 1960-09-26  Age: 63 y.o. MRN: [id number]  No chief complaint on file.   HPI Patient presents today for medication management and lab follow up. Reports compliance with medication regimen. After last visit, started Mounjaro  and Crestor . Reports that she is tolerating these well. Blood sugars at home: BP at home: Denies the need for refills. Not fasting today. Denies other concerns. Medical history as outlined below.  ROS Per HPI    Objective:     There were no vitals taken for this visit.  Physical Exam Vitals and nursing note reviewed.  Constitutional:      General: She is not in acute distress.    Appearance: Normal appearance. She is obese.  HENT:     Head: Normocephalic and atraumatic.     Right Ear: External ear normal.     Left Ear: External ear normal.     Nose: Nose normal.     Mouth/Throat:     Mouth: Mucous membranes are moist.     Pharynx: Oropharynx is clear.  Eyes:     Extraocular Movements: Extraocular movements intact.     Pupils: Pupils are equal, round, and reactive to light.  Cardiovascular:     Rate and Rhythm: Normal rate and regular rhythm.     Pulses: Normal pulses.     Heart sounds: Normal heart sounds.  Pulmonary:     Effort: Pulmonary effort is normal. No respiratory distress.     Breath sounds: Normal breath sounds. No wheezing, rhonchi or rales.  Musculoskeletal:        General: Normal range of motion.     Cervical back: Normal range of motion.     Right lower leg: No edema.     Left lower leg: No edema.  Lymphadenopathy:     Cervical: No cervical adenopathy.  Neurological:     General: No focal deficit present.     Mental Status: She is alert and oriented to person, place, and time.  Psychiatric:        Mood and Affect: Mood normal.        Thought Content: Thought content normal.      No results found for any visits on  09/28/23.   The 10-year ASCVD risk score (Arnett DK, et al., 2019) is: 15.7%    Assessment & Plan:   Type 2 diabetes mellitus with hyperglycemia, with long-term current use of insulin (HCC)  Primary hypertension     No follow-ups on file.    Wellington Half, FNP

## 2023-09-28 ENCOUNTER — Ambulatory Visit: Admitting: Family Medicine

## 2023-09-28 ENCOUNTER — Encounter: Payer: Self-pay | Admitting: Family Medicine

## 2023-09-28 ENCOUNTER — Other Ambulatory Visit (HOSPITAL_COMMUNITY): Payer: Self-pay

## 2023-09-28 ENCOUNTER — Telehealth: Payer: Self-pay

## 2023-09-28 VITALS — BP 126/70 | HR 87 | Temp 99.0°F | Ht 62.5 in | Wt 220.0 lb

## 2023-09-28 DIAGNOSIS — B372 Candidiasis of skin and nail: Secondary | ICD-10-CM

## 2023-09-28 DIAGNOSIS — Z79899 Other long term (current) drug therapy: Secondary | ICD-10-CM

## 2023-09-28 DIAGNOSIS — I10 Essential (primary) hypertension: Secondary | ICD-10-CM

## 2023-09-28 DIAGNOSIS — Z794 Long term (current) use of insulin: Secondary | ICD-10-CM

## 2023-09-28 DIAGNOSIS — E782 Mixed hyperlipidemia: Secondary | ICD-10-CM

## 2023-09-28 DIAGNOSIS — E1165 Type 2 diabetes mellitus with hyperglycemia: Secondary | ICD-10-CM

## 2023-09-28 DIAGNOSIS — E66812 Obesity, class 2: Secondary | ICD-10-CM | POA: Diagnosis not present

## 2023-09-28 DIAGNOSIS — Z6839 Body mass index (BMI) 39.0-39.9, adult: Secondary | ICD-10-CM | POA: Diagnosis not present

## 2023-09-28 MED ORDER — ROSUVASTATIN CALCIUM 10 MG PO TABS: 10.0000 mg | ORAL_TABLET | Freq: Every day | ORAL | 1 refills | Status: AC

## 2023-09-28 MED ORDER — BASAGLAR KWIKPEN 100 UNIT/ML ~~LOC~~ SOPN: PEN_INJECTOR | SUBCUTANEOUS | 3 refills | Status: AC

## 2023-09-28 MED ORDER — FREESTYLE LIBRE 3 PLUS SENSOR MISC: 3 refills | Status: DC

## 2023-09-28 NOTE — Assessment & Plan Note (Signed)
 SWITCH to Basaglar Kwikpen 25 units once a day with evening meal. Freestyle Libre 3+ sensors to pharmacy Continue to monitor blood sugars, let me know what they are looking like Monday and we will decide if we need to increase the amount of your evening insulin or not

## 2023-09-28 NOTE — Patient Instructions (Addendum)
 I have sent in Basaglar for you to take 25 units once daily with your evening meal.  I have sent in a Freestyle Libre 3 plus sensor to your pharmacy. Each sensor is good for 15 days.  Gentian violet to rash to your legs and abdomen  Follow-up with me in 3 months for medication management

## 2023-09-28 NOTE — Telephone Encounter (Signed)
 Pharmacy Patient Advocate Encounter  Received notification from [facility] that Prior Authorization for Mounjaro  2.5MG /0.5ML auto-injector  has been DENIED.  Full denial letter will be uploaded to the media tab. See denial reason below.   PA #/Case ID/Reference #: [id number]   DENIAL REASON: Must try and fail 3 preferred products: Trulicity, liraglutide, Rybelsus

## 2023-09-28 NOTE — Telephone Encounter (Signed)
 Sent message to PA team for them to initiate PA for Mounjaro .

## 2023-09-28 NOTE — Assessment & Plan Note (Signed)
 Discussed decreasing sweets and carbs in diet, exercising a little

## 2023-09-28 NOTE — Telephone Encounter (Signed)
 Pharmacy Patient Advocate Encounter   Received notification from Pt Calls Messages that prior authorization for Mounjaro  2.5MG /0.5ML auto-injector is required/requested.   Insurance verification completed.   The patient is insured through [facility] .   Per test claim: PA required; PA submitted to above mentioned insurance via CoverMyMeds Key/confirmation #/EOC [id number] Status is pending

## 2023-09-28 NOTE — Assessment & Plan Note (Signed)
 Stable, continue benazapril

## 2023-09-28 NOTE — Telephone Encounter (Signed)
 PA request has been Submitted. New Encounter has been or will be created for follow up. For additional info see Pharmacy Prior Auth telephone encounter from 09/28/23.

## 2023-09-28 NOTE — Assessment & Plan Note (Signed)
Start Crestor ?

## 2023-09-28 NOTE — Assessment & Plan Note (Signed)
 Discussed using gentian violet to the area

## 2023-10-01 MED ORDER — BENAZEPRIL HCL 10 MG PO TABS: 10.0000 mg | ORAL_TABLET | Freq: Every day | ORAL | 1 refills | Status: DC

## 2023-10-02 ENCOUNTER — Other Ambulatory Visit (HOSPITAL_COMMUNITY): Payer: Self-pay

## 2023-10-03 ENCOUNTER — Ambulatory Visit
Admission: RE | Admit: 2023-10-03 | Discharge: 2023-10-03 | Disposition: A | Discharge status: Home / Self Care | Source: Ambulatory Visit | Attending: Family Medicine | Admitting: Family Medicine

## 2023-10-03 VITALS — BP 158/78 | HR 87 | Temp 99.1°F | Resp 16

## 2023-10-03 DIAGNOSIS — R350 Frequency of micturition: Secondary | ICD-10-CM | POA: Insufficient documentation

## 2023-10-03 DIAGNOSIS — E1065 Type 1 diabetes mellitus with hyperglycemia: Secondary | ICD-10-CM | POA: Insufficient documentation

## 2023-10-03 LAB — POCT URINALYSIS DIP (MANUAL ENTRY)
Bilirubin, UA: NEGATIVE
Glucose, UA: 500 mg/dL — AB
Ketones, POC UA: NEGATIVE mg/dL
Nitrite, UA: NEGATIVE
Protein Ur, POC: NEGATIVE mg/dL
Spec Grav, UA: 1.01 (ref 1.010–1.025)
Urobilinogen, UA: 0.2 U/dL
pH, UA: 6 (ref 5.0–8.0)

## 2023-10-03 LAB — POCT FASTING CBG KUC MANUAL ENTRY: POCT Glucose (KUC): 377 mg/dL — AB (ref 70–99)

## 2023-10-03 MED ORDER — CEPHALEXIN 500 MG PO CAPS: 500.0000 mg | ORAL_CAPSULE | Freq: Two times a day (BID) | ORAL | 0 refills | Status: DC

## 2023-10-03 NOTE — Discharge Instructions (Signed)
 You have had labs (urine culture) sent today. We will call you with any significant abnormalities or if there is need to begin or change treatment or pursue further follow up.  You may also review your test results online through MyChart. If you do not have a MyChart account, instructions to sign up should be on your discharge paperwork.

## 2023-10-03 NOTE — ED Triage Notes (Signed)
 Pt reports she has frequent urination, lower abdomen pressure, tender vaginal x 2 days

## 2023-10-03 NOTE — Telephone Encounter (Signed)
 Spoke with patient, offered her an OV. She states she will try the UC by her house first as she is having to urinate a lot more frequently and does not want to risk having an accident and being embarrassed trying to make it to another office today. She will reach back out if she is not successful getting an appt with the nearby UC. She was very appreciative of the offer, and quick response

## 2023-10-05 ENCOUNTER — Ambulatory Visit (HOSPITAL_COMMUNITY): Payer: Self-pay

## 2023-10-05 MED ORDER — FLUCONAZOLE 150 MG PO TABS
150.0000 mg | ORAL_TABLET | Freq: Once | ORAL | 0 refills | Status: DC
Start: 2023-10-05 — End: 2023-10-05

## 2023-10-06 NOTE — ED Provider Notes (Addendum)
 [facility]    ASSESSMENT & PLAN:  1. Urinary frequency   2. Uncontrolled type 1 diabetes mellitus with hyperglycemia (HCC)    High blood sugars also likely contributing to urinary frequency.  Begin: Meds ordered this encounter  Medications   cephALEXin  (KEFLEX ) 500 MG capsule    Sig: Take 1 capsule (500 mg total) by mouth 2 (two) times daily.    Dispense:  10 capsule    Refill:  0   No signs of pyelonephritis. Urine culture sent. Will notify patient of any significant results. Ensure proper hydration. Will follow up with her PCP or here if not showing improvement over the next 48 hours, sooner if needed.  Outlined signs and symptoms indicating need for more acute intervention. Patient verbalized understanding. After Visit Summary given.  SUBJECTIVE:  Michaela Parker is a 63 y.o. female who complains of urinary frequency, urgency and dysuria for the past 2 days. Without associated flank pain, fever, chills, vaginal discharge or bleeding. Gross hematuria: not present. No specific aggravating or alleviating factors reported. No LE edema. Normal PO intake without n/v/d. Without specific abdominal pain. Ambulatory without difficulty. No tx PTA. LMP: No LMP recorded. Patient has had an ablation.  OBJECTIVE:  Vitals:   10/03/23 1624  BP: (!) 158/78  Pulse: 87  Resp: 16  Temp: 99.1 F (37.3 C)  TempSrc: Oral  SpO2: 93%   General appearance: alert; no distress Abdomen: soft Extremities: no edema; symmetrical with no gross deformities Skin: warm and dry Neurologic: normal gait Psychological: alert and cooperative; normal mood and affect  Labs Reviewed  URINE CULTURE   POCT URINALYSIS DIP (MANUAL ENTRY) - Abnormal; Notable for the following components:   Color, UA light yellow (*)    Clarity, UA cloudy (*)    Glucose, UA =500 (*)    Blood, UA trace-intact (*)    Leukocytes, UA Trace (*)    All other components within normal limits  POCT FASTING CBG [facility]  MANUAL ENTRY - Abnormal; Notable for the following components:   POCT Glucose ([facility]) 377 (*)    All other components within normal limits    Allergies  Allergen Reactions   Other Rash    Elastic in socks    Past Medical History:  Diagnosis Date   Anxiety    Cataract 2023   Still have 1   Diabetes mellitus without complication (HCC)    Hypertension    Not dure because i was put on benazepril    Social History   Socioeconomic History   Marital status: Married    Spouse name: Not on file   Number of children: Not on file   Years of education: Not on file   Highest education level: Not on file  Occupational History   Not on file  Tobacco Use   Smoking status: Never   Smokeless tobacco: Never  Vaping Use   Vaping status: Never Used  Substance and Sexual Activity   Alcohol use: Yes    Comment: Maybe once per month   Drug use: Never   Sexual activity: Not Currently  Other Topics Concern   Not on file  Social History Narrative   Not on file   Social Drivers of Health   Financial Resource Strain: Not on file  Food Insecurity: Not on file  Transportation Needs: Not on file  Physical Activity: Not on file  Stress: Not on file  Social Connections: Not on file  Intimate Partner Violence: Not on file  Family History  Problem Relation Age of Onset   COPD Mother    Stroke Mother    Cancer Father    Alcohol abuse Father    Cancer Sister    Heart disease Sister    Heart disease Brother    Asthma Daughter    Arthritis Maternal Grandmother    Heart disease Brother    Heart disease Sister         Afton Albright, MD 10/06/23 1204    Afton Albright, MD 10/06/23 1208

## 2023-11-06 ENCOUNTER — Telehealth: Payer: Self-pay | Admitting: Neurology

## 2023-11-06 NOTE — Telephone Encounter (Signed)
 Pt called to cancel appt   Appt cancel

## 2023-11-07 ENCOUNTER — Institutional Professional Consult (permissible substitution): Admitting: Neurology

## 2023-11-08 ENCOUNTER — Institutional Professional Consult (permissible substitution): Admitting: Neurology

## 2023-12-07 ENCOUNTER — Encounter: Payer: Self-pay | Admitting: Family Medicine

## 2023-12-07 ENCOUNTER — Ambulatory Visit: Admitting: Family Medicine

## 2023-12-07 VITALS — BP 138/66 | HR 110 | Temp 98.8°F | Ht 62.5 in | Wt 220.4 lb

## 2023-12-07 DIAGNOSIS — Z79899 Other long term (current) drug therapy: Secondary | ICD-10-CM | POA: Insufficient documentation

## 2023-12-07 DIAGNOSIS — E66812 Obesity, class 2: Secondary | ICD-10-CM

## 2023-12-07 DIAGNOSIS — N3000 Acute cystitis without hematuria: Secondary | ICD-10-CM

## 2023-12-07 DIAGNOSIS — E559 Vitamin D deficiency, unspecified: Secondary | ICD-10-CM | POA: Insufficient documentation

## 2023-12-07 DIAGNOSIS — E1165 Type 2 diabetes mellitus with hyperglycemia: Secondary | ICD-10-CM | POA: Diagnosis not present

## 2023-12-07 DIAGNOSIS — I10 Essential (primary) hypertension: Secondary | ICD-10-CM | POA: Diagnosis not present

## 2023-12-07 DIAGNOSIS — E782 Mixed hyperlipidemia: Secondary | ICD-10-CM | POA: Diagnosis not present

## 2023-12-07 DIAGNOSIS — Z6839 Body mass index (BMI) 39.0-39.9, adult: Secondary | ICD-10-CM

## 2023-12-07 DIAGNOSIS — J069 Acute upper respiratory infection, unspecified: Secondary | ICD-10-CM

## 2023-12-07 DIAGNOSIS — Z794 Long term (current) use of insulin: Secondary | ICD-10-CM

## 2023-12-07 DIAGNOSIS — B9689 Other specified bacterial agents as the cause of diseases classified elsewhere: Secondary | ICD-10-CM | POA: Insufficient documentation

## 2023-12-07 LAB — CBC WITH DIFFERENTIAL/PLATELET
Basophils Absolute: 0.1 K/uL (ref 0.0–0.1)
Basophils Relative: 0.3 % (ref 0.0–3.0)
Eosinophils Absolute: 0.1 K/uL (ref 0.0–0.7)
Eosinophils Relative: 0.8 % (ref 0.0–5.0)
HCT: 43.9 % (ref 36.0–46.0)
Hemoglobin: 14.8 g/dL (ref 12.0–15.0)
Lymphocytes Relative: 26.1 % (ref 12.0–46.0)
Lymphs Abs: 4 K/uL (ref 0.7–4.0)
MCHC: 33.7 g/dL (ref 30.0–36.0)
MCV: 91.3 fl (ref 78.0–100.0)
Monocytes Absolute: 0.7 K/uL (ref 0.1–1.0)
Monocytes Relative: 4.8 % (ref 3.0–12.0)
Neutro Abs: 10.5 K/uL — ABNORMAL HIGH (ref 1.4–7.7)
Neutrophils Relative %: 68 % (ref 43.0–77.0)
Platelets: 224 K/uL (ref 150.0–400.0)
RBC: 4.8 Mil/uL (ref 3.87–5.11)
RDW: 13.5 % (ref 11.5–15.5)
WBC: 15.5 K/uL — ABNORMAL HIGH (ref 4.0–10.5)

## 2023-12-07 LAB — COMPREHENSIVE METABOLIC PANEL WITH GFR
ALT: 16 U/L (ref 0–35)
AST: 15 U/L (ref 0–37)
Albumin: 4.3 g/dL (ref 3.5–5.2)
Alkaline Phosphatase: 50 U/L (ref 39–117)
BUN: 16 mg/dL (ref 6–23)
CO2: 27 meq/L (ref 19–32)
Calcium: 9.7 mg/dL (ref 8.4–10.5)
Chloride: 100 meq/L (ref 96–112)
Creatinine, Ser: 0.65 mg/dL (ref 0.40–1.20)
GFR: 93.63 mL/min (ref >60.00)
Glucose, Bld: 216 mg/dL — ABNORMAL HIGH (ref 70–99)
Potassium: 4.1 meq/L (ref 3.5–5.1)
Sodium: 136 meq/L (ref 135–145)
Total Bilirubin: 0.5 mg/dL (ref 0.2–1.2)
Total Protein: 6.9 g/dL (ref 6.0–8.3)

## 2023-12-07 LAB — LIPID PANEL
Cholesterol: 102 mg/dL (ref 0–200)
HDL: 30.5 mg/dL — ABNORMAL LOW (ref >39.00)
LDL Cholesterol: 58 mg/dL (ref 0–99)
NonHDL: 71.09
Total CHOL/HDL Ratio: 3
Triglycerides: 67 mg/dL (ref 0.0–149.0)
VLDL: 13.4 mg/dL (ref 0.0–40.0)

## 2023-12-07 LAB — MICROALBUMIN / CREATININE URINE RATIO
Creatinine,U: 41.5 mg/dL
Microalb Creat Ratio: 368.2 mg/g — ABNORMAL HIGH (ref 0.0–30.0)
Microalb, Ur: 15.3 mg/dL — ABNORMAL HIGH (ref 0.0–1.9)

## 2023-12-07 LAB — POC URINALSYSI DIPSTICK (AUTOMATED)
Bilirubin, UA: NEGATIVE
Glucose, UA: POSITIVE — AB
Ketones, UA: NEGATIVE
Nitrite, UA: NEGATIVE
Protein, UA: POSITIVE — AB
Spec Grav, UA: 1.01 (ref 1.010–1.025)
Urobilinogen, UA: 0.2 U/dL
pH, UA: 6 (ref 5.0–8.0)

## 2023-12-07 LAB — HEMOGLOBIN A1C: Hgb A1c MFr Bld: 9.6 % — ABNORMAL HIGH (ref 4.6–6.5)

## 2023-12-07 LAB — TSH: TSH: 1.24 u[IU]/mL (ref 0.35–5.50)

## 2023-12-07 LAB — VITAMIN B12: Vitamin B-12: 378 pg/mL (ref 211–911)

## 2023-12-07 LAB — VITAMIN D 25 HYDROXY (VIT D DEFICIENCY, FRACTURES): VITD: 31.72 ng/mL (ref 30.00–100.00)

## 2023-12-07 MED ORDER — CIPROFLOXACIN HCL 500 MG PO TABS: 500.0000 mg | ORAL_TABLET | Freq: Two times a day (BID) | ORAL | 0 refills | Status: AC

## 2023-12-07 MED ORDER — FLUCONAZOLE 150 MG PO TABS: 150.0000 mg | ORAL_TABLET | Freq: Once | ORAL | 1 refills | Status: AC

## 2023-12-07 NOTE — Patient Instructions (Addendum)
 We are checking labs today, will be in contact with any results that require further attention.  Your urine was concerning for infection in the office today. We have sent it to the lab for culture and confirmation.   Cipro  twice a day for 10 days. Eat with this medication, it can upset your stomach if you do not.   I have also sent in Diflucan  for you to take in case of yeast after antibiotic treatment.  If you are having symptoms when you complete antibiotics, may take 1 tablet.  If you are still having symptoms 3 days later, may take the second tablet.  Follow-up with me on Tuesday as scheduled.

## 2023-12-07 NOTE — Addendum Note (Signed)
 Addended by: LEAR, Ayo Guarino P on: 12/07/2023 01:22 PM   Modules accepted: Orders

## 2023-12-07 NOTE — Progress Notes (Signed)
 Acute Office Visit  Subjective:     Patient ID: Michaela Parker, female    DOB: April 04, 1961, 63 y.o.   MRN: [id number]  Chief Complaint  Patient presents with   Cold Exposure    Pt has not being feeling well for the past 11 days, has gotten better, still experiencing cough. Stated she might has UTI due frequent urine and abdominal pain/pressure     HPI  Discussed the use of AI scribe software for clinical note transcription with the patient, who gave verbal consent to proceed.  History of Present Illness Michaela Parker is a 63 year old female with diabetes who presents with excessive sleepiness and urinary frequency.  Excessive daytime sleepiness - Excessive sleepiness throughout the day - Falls asleep at work and while driving - Requires husband to wake her at home  Urinary frequency and urinary symptoms - Urinary frequency every 15 to 30 minutes - Cloudy urine - No dysuria or hematuria - History of recurrent urinary tract infections  Glycemic control - Home blood glucose readings range from 200 to 219 mg/dL  Constitutional symptoms - Fever up to 101F on Monday, now resolved - Occasional nausea     ROS Per HPI      Objective:    BP 138/66   Pulse (!) 110   Temp 98.8 F (37.1 C) (Temporal)   Ht 5' 2.5 (1.588 m)   Wt 220 lb 6 oz (100 kg)   SpO2 94%   BMI 39.66 kg/m    Physical Exam Vitals and nursing note reviewed.  Constitutional:      General: She is not in acute distress.    Appearance: She is obese. She is ill-appearing.     Comments: Appears fatigued  HENT:     Head: Normocephalic and atraumatic.     Right Ear: External ear normal.     Left Ear: External ear normal.     Nose: Congestion present.     Mouth/Throat:     Mouth: Mucous membranes are moist.     Pharynx: Oropharynx is clear. No oropharyngeal exudate or posterior oropharyngeal erythema.     Comments: Oropharyngeal cobblestoning   Eyes:     Extraocular Movements: Extraocular movements  intact.     Pupils: Pupils are equal, round, and reactive to light.  Cardiovascular:     Rate and Rhythm: Normal rate and regular rhythm.     Pulses: Normal pulses.     Heart sounds: Normal heart sounds.  Pulmonary:     Effort: Pulmonary effort is normal. No respiratory distress.     Breath sounds: Wheezing and rhonchi present. No rales.     Comments: Moderate cough in office Musculoskeletal:        General: Normal range of motion.     Cervical back: Normal range of motion and neck supple.     Right lower leg: No edema.     Left lower leg: No edema.  Lymphadenopathy:     Cervical: Cervical adenopathy present.  Skin:    General: Skin is warm and dry.  Neurological:     General: No focal deficit present.     Mental Status: She is alert and oriented to person, place, and time.  Psychiatric:        Mood and Affect: Mood normal.        Thought Content: Thought content normal.     No results found for any visits on 12/07/23.      Assessment & Plan:  Assessment and Plan Assessment & Plan Acute cystitis (urinary tract infection) Recurrent UTI with increased frequency, cloudy urine, and lower abdominal pain. No hematuria. Possible dietary contribution from soft drinks. - Send urine sample for culture. - Prescribe Ciprofloxacin  500 mg orally twice daily for 10 days. - Prescribe antifungal medication to prevent yeast infection secondary to antibiotic use.  Acute upper respiratory infection Symptoms include fatigue, nausea, and mild fever. No wheezing or shortness of breath. Ciprofloxacin  considered for dual coverage. - Prescribe Ciprofloxacin  500 mg orally twice daily for 10 days.  Type 2 diabetes mellitus with hyperglycemia Blood glucose levels 200-219 mg/dL, not significantly elevated. - Order laboratory tests including A1c to assess current diabetes control.  HTN - CBC, CMP today - stable, mildly elevated today due to illness  Vit D Deficiency - Vit D today - continue  supplementation  Med Mgt - Continue current med regimen Labs today     Orders Placed This Encounter  Procedures   CBC with Differential/Platelet    Release to patient:   Immediate [1]   Comprehensive metabolic panel with GFR    Release to patient:   Immediate [1]   Hemoglobin A1c   Lipid panel   Microalbumin / creatinine urine ratio    Release to patient:   Immediate   TSH   Vitamin B12   VITAMIN D  25 Hydroxy (Vit-D Deficiency, Fractures)     Meds ordered this encounter  Medications   ciprofloxacin  (CIPRO ) 500 MG tablet    Sig: Take 1 tablet (500 mg total) by mouth 2 (two) times daily for 10 days.    Dispense:  20 tablet    Refill:  0   fluconazole  (DIFLUCAN ) 150 MG tablet    Sig: Take 1 tablet (150 mg total) by mouth once for 1 dose.    Dispense:  1 tablet    Refill:  1    Return for as scheduled.  Corean LITTIE Ku, FNP

## 2023-12-11 ENCOUNTER — Ambulatory Visit: Payer: Self-pay | Admitting: Family Medicine

## 2023-12-11 ENCOUNTER — Encounter: Payer: Self-pay | Admitting: Family Medicine

## 2023-12-11 ENCOUNTER — Ambulatory Visit (INDEPENDENT_AMBULATORY_CARE_PROVIDER_SITE_OTHER): Admitting: Family Medicine

## 2023-12-11 VITALS — BP 132/80 | HR 84 | Temp 98.7°F | Ht 62.5 in | Wt 221.4 lb

## 2023-12-11 DIAGNOSIS — E1165 Type 2 diabetes mellitus with hyperglycemia: Secondary | ICD-10-CM

## 2023-12-11 DIAGNOSIS — E66812 Obesity, class 2: Secondary | ICD-10-CM

## 2023-12-11 DIAGNOSIS — E559 Vitamin D deficiency, unspecified: Secondary | ICD-10-CM | POA: Diagnosis not present

## 2023-12-11 DIAGNOSIS — Z794 Long term (current) use of insulin: Secondary | ICD-10-CM | POA: Diagnosis not present

## 2023-12-11 DIAGNOSIS — Z6839 Body mass index (BMI) 39.0-39.9, adult: Secondary | ICD-10-CM

## 2023-12-11 DIAGNOSIS — Z79899 Other long term (current) drug therapy: Secondary | ICD-10-CM | POA: Diagnosis not present

## 2023-12-11 DIAGNOSIS — I10 Essential (primary) hypertension: Secondary | ICD-10-CM | POA: Diagnosis not present

## 2023-12-11 DIAGNOSIS — E782 Mixed hyperlipidemia: Secondary | ICD-10-CM | POA: Diagnosis not present

## 2023-12-11 DIAGNOSIS — N3000 Acute cystitis without hematuria: Secondary | ICD-10-CM

## 2023-12-11 MED ORDER — GLIPIZIDE 5 MG PO TABS: 5.0000 mg | ORAL_TABLET | Freq: Two times a day (BID) | ORAL | 3 refills | Status: AC

## 2023-12-11 MED ORDER — DEXCOM G6 TRANSMITTER MISC: 1.0000 | Freq: Once | 3 refills | Status: DC

## 2023-12-11 MED ORDER — DEXCOM G6 SENSOR MISC
1.0000 | 3 refills | Status: DC
Start: 2023-12-11 — End: 2024-01-08

## 2023-12-11 NOTE — Progress Notes (Signed)
 Discussed all labs and office visit on 12/11/2023

## 2023-12-11 NOTE — Progress Notes (Signed)
 Established Patient Office Visit  Subjective:     Patient ID: Michaela Parker, female    DOB: [birth year], 63 y.o.   MRN: [id number]  Chief Complaint  Patient presents with   Diabetes   Follow-up   Urinary Tract Infection    HPI  Discussed the use of AI scribe software for clinical note transcription with the patient, who gave verbal consent to proceed.  History of Present Illness Michaela Parker is a 63 year old female with diabetes who presents with uncontrolled blood sugar levels and concerns about insulin management.  Hyperglycemia and insulin management - Difficulty managing blood glucose despite insulin therapy. - Initial insulin regimen started at 25 units, increased by 5 units every four days, reaching 100 units. - Blood glucose remains above 300 mg/dL, peaking at 587 mg/dL. - No hypoglycemic episodes reported. - Returned to previous insulin due to insurance and efficacy issues. - Blood glucose measured at 402 mg/dL despite dietary efforts.  Urinary symptoms and abdominal pain - Recent episode of cloudy urine accompanied by abdominal pain, hot and cold sensations, and low-grade fever. - History of urinary tract infections, with a recent episode in June. - Considering cranberry pills for UTI prevention.  Weight management and dietary concerns - Desires weight loss and improved health. - Feels unhappy and frustrated with current health status. - Considering dietary changes and aware of the impact of sugar on arthritis. - Husband also struggles with weight and sugar intake. - Consumes two diet cherry colas daily and a large amount of water.     ROS Per HPI      Objective:    BP 132/80 (BP Location: Left Arm, Patient Position: Sitting, Cuff Size: Normal)   Pulse 84   Temp 98.7 F (37.1 C)   Ht 5' 2.5 (1.588 m)   Wt 221 lb 6.4 oz (100.4 kg)   SpO2 97%   BMI 39.85 kg/m    Physical Exam Vitals and nursing note reviewed.  Constitutional:      General: She is  not in acute distress.    Appearance: Normal appearance. She is obese.  HENT:     Head: Normocephalic and atraumatic.     Right Ear: External ear normal.     Left Ear: External ear normal.     Nose: Nose normal.     Mouth/Throat:     Mouth: Mucous membranes are moist.     Pharynx: Oropharynx is clear.  Eyes:     Extraocular Movements: Extraocular movements intact.     Pupils: Pupils are equal, round, and reactive to light.  Cardiovascular:     Rate and Rhythm: Normal rate and regular rhythm.     Pulses: Normal pulses.     Heart sounds: Normal heart sounds.  Pulmonary:     Effort: Pulmonary effort is normal. No respiratory distress.     Breath sounds: Normal breath sounds. No wheezing, rhonchi or rales.  Musculoskeletal:        General: Normal range of motion.     Cervical back: Normal range of motion.     Right lower leg: No edema.     Left lower leg: No edema.  Lymphadenopathy:     Cervical: No cervical adenopathy.  Neurological:     General: No focal deficit present.     Mental Status: She is alert and oriented to person, place, and time.  Psychiatric:        Mood and Affect: Mood normal.  Thought Content: Thought content normal.     No results found for any visits on 12/11/23.  The ASCVD Risk score (Arnett DK, et al., 2019) failed to calculate for the following reasons:   The valid total cholesterol range is 130 to 320 mg/dL  BP Readings from Last 3 Encounters:  12/11/23 132/80  12/07/23 138/66  10/03/23 (!) 158/78   Wt Readings from Last 3 Encounters:  12/11/23 221 lb 6.4 oz (100.4 kg)  12/07/23 220 lb 6 oz (100 kg)  09/28/23 220 lb (99.8 kg)      Last CBC Lab Results  Component Value Date   WBC 15.5 (H) 12/07/2023   HGB 14.8 12/07/2023   HCT 43.9 12/07/2023   MCV 91.3 12/07/2023   MCH 30.2 02/16/2023   RDW 13.5 12/07/2023   PLT 224.0 12/07/2023   Last metabolic panel Lab Results  Component Value Date   GLUCOSE 216 (H) 12/07/2023   NA 136  12/07/2023   K 4.1 12/07/2023   CL 100 12/07/2023   CO2 27 12/07/2023   BUN 16 12/07/2023   CREATININE 0.65 12/07/2023   GFR 93.63 12/07/2023   CALCIUM  9.7 12/07/2023   PROT 6.9 12/07/2023   ALBUMIN 4.3 12/07/2023   LABGLOB 2.2 02/16/2023   BILITOT 0.5 12/07/2023   ALKPHOS 50 12/07/2023   AST 15 12/07/2023   ALT 16 12/07/2023   Last lipids Lab Results  Component Value Date   CHOL 102 12/07/2023   HDL 30.50 (L) 12/07/2023   LDLCALC 58 12/07/2023   TRIG 67.0 12/07/2023   CHOLHDL 3 12/07/2023   Last hemoglobin A1c Lab Results  Component Value Date   HGBA1C 9.6 (H) 12/07/2023   Last thyroid functions Lab Results  Component Value Date   TSH 1.24 12/07/2023   Last vitamin D  Lab Results  Component Value Date   VD25OH 31.72 12/07/2023   Last vitamin B12 and Folate Lab Results  Component Value Date   VITAMINB12 378 12/07/2023         Assessment & Plan:   Assessment and Plan Assessment & Plan Type 2 diabetes mellitus with hyperglycemia Persistent hyperglycemia despite insulin therapy due to insurance issues. Considered endocrinology referral and alternative medications. Decided to try glipizide . - Refer to endocrinology for specialized diabetes management. - Prescribe glipizide  to be taken with breakfast and dinner. - Continue current long-acting insulin regimen. - Schedule follow-up in one month or sooner if seen by endocrinology. - Will try Dexcom, has trouble keeping Freestyle Libre in place  Acute cystitis (urinary tract infection) Elevated white blood cell count. - Consider future urine culture if symptoms persist. - Evaluate need for urinalysis at next visit in one month.  Obesity, class 2 Desire to lose weight. Discussed impact on diabetes and overall health. Considered lifestyle changes and weight loss medications. - Encourage lifestyle modifications for weight loss. - Discuss potential weight loss medications with endocrinology.  Vitamin D   deficiency Deficiency persists despite supplementation. Discussed body's inability to store vitamin D . - Determine current dosage of vitamin D3 and K2. - Consider increasing vitamin D  supplementation based on current intake. - Encourage sun exposure as a natural source of vitamin D .  Mixed HLD - controlled, continue rosuvastatin        Orders Placed This Encounter  Procedures   Ambulatory referral to Endocrinology    Referral Priority:   Routine    Referral Type:   Consultation    Referral Reason:   Specialty Services Required    Referred to Provider:  Roy Harlene HERO, NP    Number of Visits Requested:   1     Meds ordered this encounter  Medications   Continuous Glucose Transmitter (DEXCOM G6 TRANSMITTER) MISC    Sig: 1 each by Does not apply route once for 1 dose.    Dispense:  1 each    Refill:  3   Continuous Glucose Sensor (DEXCOM G6 SENSOR) MISC    Sig: 1 each by Does not apply route every 14 (fourteen) days for 28 days.    Dispense:  2 each    Refill:  3   glipiZIDE  (GLUCOTROL ) 5 MG tablet    Sig: Take 1 tablet (5 mg total) by mouth 2 (two) times daily before a meal.    Dispense:  60 tablet    Refill:  3    Return in about 4 weeks (around 01/08/2024) for med check.  Corean LITTIE Ku, FNP

## 2023-12-12 ENCOUNTER — Telehealth: Payer: Self-pay

## 2023-12-12 ENCOUNTER — Other Ambulatory Visit: Payer: Self-pay

## 2023-12-12 DIAGNOSIS — E1165 Type 2 diabetes mellitus with hyperglycemia: Secondary | ICD-10-CM

## 2023-12-12 MED ORDER — DEXCOM G6 TRANSMITTER MISC: 1.0000 | Freq: Once | 3 refills | Status: AC

## 2023-12-12 MED ORDER — DEXCOM G6 SENSOR MISC: 3 refills | Status: DC

## 2023-12-12 NOTE — Telephone Encounter (Signed)
 Re-sent to pharmacy.

## 2023-12-12 NOTE — Telephone Encounter (Signed)
 Copied from CRM #[id number]. Topic: Clinical - Medication Question >> Dec 11, 2023  2:24 PM Frederich PARAS wrote: Reason for CRM: Ronal Rogue from [address] , they got a prescription, need clarification because  it comes in packs of 3, and you change it every 10 days insteaf of every 14days  Continuous Glucose Transmitter (DEXCOM G6 TRANSMITTER) [phone] [facility] for any questions

## 2023-12-13 ENCOUNTER — Encounter: Payer: Self-pay | Admitting: Family Medicine

## 2024-01-04 DIAGNOSIS — E785 Hyperlipidemia, unspecified: Secondary | ICD-10-CM | POA: Diagnosis not present

## 2024-01-04 DIAGNOSIS — I10 Essential (primary) hypertension: Secondary | ICD-10-CM | POA: Diagnosis not present

## 2024-01-04 DIAGNOSIS — E1165 Type 2 diabetes mellitus with hyperglycemia: Secondary | ICD-10-CM | POA: Diagnosis not present

## 2024-01-12 ENCOUNTER — Encounter: Payer: Self-pay | Admitting: Family Medicine

## 2024-01-15 ENCOUNTER — Encounter: Payer: Self-pay | Admitting: Family Medicine

## 2024-01-16 ENCOUNTER — Other Ambulatory Visit: Payer: Self-pay

## 2024-01-16 DIAGNOSIS — E1165 Type 2 diabetes mellitus with hyperglycemia: Secondary | ICD-10-CM

## 2024-01-16 MED ORDER — DEXCOM G7 RECEIVER DEVI: 1.0000 | Freq: Once | 0 refills | Status: DC

## 2024-01-16 MED ORDER — DEXCOM G7 SENSOR MISC: 10 refills | Status: DC

## 2024-02-21 DIAGNOSIS — E1165 Type 2 diabetes mellitus with hyperglycemia: Secondary | ICD-10-CM | POA: Diagnosis not present

## 2024-02-21 DIAGNOSIS — E785 Hyperlipidemia, unspecified: Secondary | ICD-10-CM | POA: Diagnosis not present

## 2024-02-21 DIAGNOSIS — I10 Essential (primary) hypertension: Secondary | ICD-10-CM | POA: Diagnosis not present

## 2024-03-30 ENCOUNTER — Other Ambulatory Visit: Payer: Self-pay | Admitting: Family Medicine

## 2024-04-27 ENCOUNTER — Other Ambulatory Visit: Payer: Self-pay | Admitting: Family Medicine

## 2024-04-27 DIAGNOSIS — E1165 Type 2 diabetes mellitus with hyperglycemia: Secondary | ICD-10-CM

## 2024-05-06 ENCOUNTER — Telehealth: Payer: Self-pay

## 2024-05-06 ENCOUNTER — Other Ambulatory Visit: Payer: Self-pay

## 2024-05-06 ENCOUNTER — Encounter: Payer: Self-pay | Admitting: Family Medicine

## 2024-05-06 ENCOUNTER — Other Ambulatory Visit: Payer: Self-pay | Admitting: Family Medicine

## 2024-05-06 DIAGNOSIS — E1165 Type 2 diabetes mellitus with hyperglycemia: Secondary | ICD-10-CM

## 2024-05-06 MED ORDER — FREESTYLE LIBRE 3 READER DEVI: 1.0000 | Freq: Every day | 0 refills | Status: AC

## 2024-05-06 MED ORDER — FREESTYLE LIBRE 3 PLUS SENSOR MISC: 1 refills | Status: AC

## 2024-05-06 NOTE — Telephone Encounter (Signed)
 Copied from CRM [id number]. Topic: Clinical - Prescription Issue >> May 06, 2024  3:33 PM Victoria A wrote: Reason for CRM: [facility] called and wants to know if Continuous Glucose Sensor (FREESTYLE LIBRE 3 PLUS SENSOR) MISC for every 15 days

## 2024-05-06 NOTE — Telephone Encounter (Signed)
 Spoke with Ronal Hun, pharmacist at [facility]. Verbals given to change script to every 15 days.

## 2024-05-16 ENCOUNTER — Other Ambulatory Visit (HOSPITAL_COMMUNITY): Payer: Self-pay

## 2024-05-19 ENCOUNTER — Other Ambulatory Visit (HOSPITAL_COMMUNITY): Payer: Self-pay

## 2024-05-19 ENCOUNTER — Telehealth: Payer: Self-pay | Admitting: Pharmacy Technician

## 2024-05-19 NOTE — Telephone Encounter (Signed)
 Pharmacy Patient Advocate Encounter   Received notification from [facility] that prior authorization for FreeStyle Libre 3 Plus Sensor is required/requested.   Insurance verification completed.   The patient is insured through [employer].   Per test claim: PA required; PA submitted to above mentioned insurance via Latent Key/confirmation #/EOC [facility] Status is pending

## 2024-05-19 NOTE — Telephone Encounter (Signed)
 Pharmacy Patient Advocate Encounter  Received notification from [employer] that Prior Authorization for FreeStyle Libre 3 Plus Sensor has been APPROVED from 05/16/2024 to 05/19/2025.   PA #/Case ID/Reference #: [id number]
